# Patient Record
Sex: Male | Born: 1990 | Race: Black or African American | Hispanic: No | Marital: Single | Smoking: Never smoker
Health system: Southern US, Community
[De-identification: ages and names within clinical notes are randomized; demographics above are authoritative.]

## PROBLEM LIST (undated history)

## (undated) HISTORY — PX: HAND SURGERY: SHX662

---
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
## 2009-12-30 ENCOUNTER — Emergency Department (HOSPITAL_COMMUNITY): Admission: EM | Admit: 2009-12-30 | Discharge: 2009-12-30 | Payer: Self-pay | Admitting: Emergency Medicine

## 2010-08-12 LAB — DIFFERENTIAL
Basophils Absolute: 0 10*3/uL (ref 0.0–0.1)
Basophils Relative: 0 % (ref 0–1)
Eosinophils Absolute: 0 10*3/uL (ref 0.0–0.7)
Eosinophils Relative: 0 % (ref 0–5)
Lymphocytes Relative: 13 % (ref 12–46)
Lymphs Abs: 0.9 10*3/uL (ref 0.7–4.0)
Monocytes Absolute: 0.6 10*3/uL (ref 0.1–1.0)
Monocytes Relative: 9 % (ref 3–12)
Neutro Abs: 5.6 10*3/uL (ref 1.7–7.7)
Neutrophils Relative %: 78 % — ABNORMAL HIGH (ref 43–77)

## 2010-08-12 LAB — COMPREHENSIVE METABOLIC PANEL
ALT: 24 U/L (ref 0–53)
AST: 29 U/L (ref 0–37)
Albumin: 4.4 g/dL (ref 3.5–5.2)
Alkaline Phosphatase: 49 U/L (ref 39–117)
BUN: 5 mg/dL — ABNORMAL LOW (ref 6–23)
CO2: 25 mEq/L (ref 19–32)
Calcium: 9.6 mg/dL (ref 8.4–10.5)
Chloride: 99 mEq/L (ref 96–112)
Creatinine, Ser: 1.04 mg/dL (ref 0.4–1.5)
GFR calc Af Amer: >60 mL/min (ref >60)
GFR calc non Af Amer: >60 mL/min (ref >60)
Glucose, Bld: 115 mg/dL — ABNORMAL HIGH (ref 70–99)
Potassium: 3.3 mEq/L — ABNORMAL LOW (ref 3.5–5.1)
Sodium: 135 mEq/L (ref 135–145)
Total Bilirubin: 1.1 mg/dL (ref 0.3–1.2)
Total Protein: 8.4 g/dL — ABNORMAL HIGH (ref 6.0–8.3)

## 2010-08-12 LAB — CBC
HCT: 46.6 % (ref 39.0–52.0)
Hemoglobin: 16.8 g/dL (ref 13.0–17.0)
MCH: 31.1 pg (ref 26.0–34.0)
MCHC: 36.1 g/dL — ABNORMAL HIGH (ref 30.0–36.0)
MCV: 86.3 fL (ref 78.0–100.0)
Platelets: 199 10*3/uL (ref 150–400)
RBC: 5.4 MIL/uL (ref 4.22–5.81)
RDW: 11.2 % — ABNORMAL LOW (ref 11.5–15.5)
WBC: 7.1 10*3/uL (ref 4.0–10.5)

## 2010-08-12 LAB — URINALYSIS, ROUTINE W REFLEX MICROSCOPIC
Hgb urine dipstick: NEGATIVE
Specific Gravity, Urine: 1.027 (ref 1.005–1.030)
Urobilinogen, UA: 0.2 mg/dL (ref 0.0–1.0)

## 2010-08-12 LAB — URINE CULTURE: Culture  Setup Time: 201108041050

## 2010-08-12 LAB — GC/CHLAMYDIA PROBE AMP, URINE
Chlamydia, Swab/Urine, PCR: POSITIVE — AB
GC Probe Amp, Urine: NEGATIVE

## 2010-08-12 LAB — URINE MICROSCOPIC-ADD ON

## 2010-10-14 NOTE — Op Note (Signed)
   NAME:  Jeffrey Greene, Jeffrey Greene                         ACCOUNT NO.:  [id number]   MEDICAL RECORD NO.:  [id number]                   PATIENT TYPE:  AMB   LOCATION:  DSC                                  FACILITY:  [facility]   PHYSICIAN:  Matthew A. Mina Marble, M.D.           DATE OF BIRTH:  06/02/1990   DATE OF PROCEDURE:  02/26/2002  DATE OF DISCHARGE:  02/26/2002                                 OPERATIVE REPORT   PREOPERATIVE DIAGNOSIS:  Left thumb Bennett's type fracture.   POSTOPERATIVE DIAGNOSIS:  Left thumb Bennett's type fracture.   PROCEDURE:  Closed reduction and percutaneous pinning of above.   SURGEON:  Artist Pais. Mina Marble, M.D.   ASSISTANT:  ________.   ANESTHESIA:  General.   TOURNIQUET TIME:  20 minutes.   COMPLICATIONS:  None.   DESCRIPTION OF PROCEDURE:  The patient was taken to the operating room and  after the induction of general anesthesia, the left upper arm was prepped  and draped in the usual sterile fashion. The arm was elevated for five  minutes and the tourniquet was inflated to 100  mmHg.   At this point in time a closed reduction was performed of a fracture at the  base of the  thumb metacarpal, extra-articular and not involving the physis.  The reduction was performed and good reduction was achieved in both AP and  lateral and oblique views.   At this point in time an 0 4-5 K-wire was driven across the fracture site  from distal to proximal, thus maintaining the reduction. A second K-wire was  then paralleled more proximal. Intraoperative x-ray showed good reduction  and good placement of hardware in the AP and lateral and oblique views. The  wires were upon themselves outside the skin.  The patient was  then placed  in a sterile dressing with Xeroform, 4 x 4s  and a radial nerve splint.   The patient tolerated the procedure well.  He was  transferred to the  recovery room in stable fashion.                                               Artist Pais  Mina Marble, M.D.    MAW/MEDQ  D:  02/26/2002  T:  03/01/2002  Job:  [id number]

## 2011-06-23 ENCOUNTER — Emergency Department (HOSPITAL_COMMUNITY)
Admission: EM | Admit: 2011-06-23 | Discharge: 2011-06-24 | Disposition: A | Discharge status: Home / Self Care | Payer: Self-pay | Attending: Emergency Medicine | Admitting: Emergency Medicine

## 2011-06-23 ENCOUNTER — Encounter (HOSPITAL_COMMUNITY): Payer: Self-pay | Admitting: *Deleted

## 2011-06-23 DIAGNOSIS — S8990XA Unspecified injury of unspecified lower leg, initial encounter: Secondary | ICD-10-CM | POA: Insufficient documentation

## 2011-06-23 DIAGNOSIS — S93409A Sprain of unspecified ligament of unspecified ankle, initial encounter: Secondary | ICD-10-CM | POA: Insufficient documentation

## 2011-06-23 DIAGNOSIS — X500XXA Overexertion from strenuous movement or load, initial encounter: Secondary | ICD-10-CM | POA: Insufficient documentation

## 2011-06-23 MED ORDER — HYDROCODONE-ACETAMINOPHEN 5-325 MG PO TABS
1.0000 | ORAL_TABLET | Freq: Once | ORAL | Status: AC
  Administered 2011-06-23: 1 via ORAL
  Filled 2011-06-23: qty 1

## 2011-06-23 NOTE — ED Notes (Signed)
The pt is c/o some lt foot and ankle pain.  He stepped on a, limb earlier today twisting his foot and ankle.  C/o  pain

## 2011-06-23 NOTE — ED Provider Notes (Signed)
History     CSN: [id number]  Arrival date & time 06/23/11  2259   First MD Initiated Contact with Patient 06/23/11 2339      Chief Complaint  Patient presents with  . Foot Injury    (Consider location/radiation/quality/duration/timing/severity/associated sxs/prior treatment)  Patient is a 21 y.o. male presenting with foot injury. The history is provided by the patient.  Foot Injury    21 year old AA male presents after "rolling" his left ankle earlier today. Patient states he was walking and stepped on a branch and rolled his L ankle laterally. The incident occurred about noon (11 hrs earlier) today. Patient rates the pain as 10/10 and is located on the top and bottom of his L foot. Denies any pain in the ankle or leg.    History reviewed. No pertinent past medical history.  History reviewed. No pertinent past surgical history.  History reviewed. No pertinent family history.  History  Substance Use Topics  . Smoking status: Current Everyday Smoker  . Smokeless tobacco: Not on file  . Alcohol Use: No      Review of Systems All pertinent positives and negatives reviewed in the history of present illness  Allergies  Review of patient's allergies indicates no known allergies.  Home Medications  No current outpatient prescriptions on file.  BP 122/59  Pulse 92  Temp(Src) 97.4 F (36.3 C) (Oral)  Resp 16  SpO2 97%  Physical Exam  Constitutional: He appears well-developed and well-nourished. No distress.  HENT:  Head: Normocephalic and atraumatic.  Musculoskeletal:       Left ankle: He exhibits decreased range of motion. He exhibits no swelling, no deformity and normal pulse. no tenderness.       Left foot: He exhibits decreased range of motion. He exhibits no tenderness, no bony tenderness, no swelling, normal capillary refill and no deformity.       No edema present. Pulses present. Able to flex and extend L foot and ankle. Unable to evert and invert L foot.     Skin: He is not diaphoretic.    ED Course  Procedures (including critical care time)  Labs Reviewed - No data to display Dg Ankle Complete Left  06/24/2011  *RADIOLOGY REPORT*  Clinical Data: Pain after fall  LEFT ANKLE COMPLETE - 3+ VIEW  Comparison: None.  Findings: The left ankle appears intact.  No evidence of acute fracture or subluxation.  No focal bone lesion or bone destruction. Ankle mortis and talar dome appear intact.  Bone cortex and trabecular architecture appear intact.  Soft tissue calcifications consistent with phleboliths.  No radiopaque foreign bodies.  IMPRESSION: No acute bony abnormalities demonstrated.  Original Report Authenticated By: Marlon Pel, M.D.   Dg Foot Complete Left  06/24/2011  *RADIOLOGY REPORT*  Clinical Data: The dorsal and plantar surface pain after fall.  LEFT FOOT - COMPLETE 3+ VIEW  Comparison: None.  Findings: The left foot appears intact. No evidence of acute fracture or subluxation.  No focal bone lesions.  Bone matrix and cortex appear intact.  No abnormal radiopaque densities in the soft tissues.  IMPRESSION: No acute bony abnormalities identified.  Original Report Authenticated By: Marlon Pel, M.D.       Patient has no fractures noted on x-ray.  We will have him followup with orthopedics as needed.  ASO was applied.  Told to ice and elevate the ankle.  Pain control be given for home  MDM  MDM Reviewed: nursing note and vitals  Interpretation: x-ray            Carlyle Dolly, PA-C 06/24/11 [phone]

## 2011-06-23 NOTE — ED Notes (Signed)
Patient states that he twisted left ankle earlier today, complaining of pain on top of left foot and bottom of left foot.  No deformity or swelling noted at this time.

## 2011-06-24 ENCOUNTER — Emergency Department (HOSPITAL_COMMUNITY): Payer: Self-pay

## 2011-06-24 MED ORDER — HYDROCODONE-ACETAMINOPHEN 5-325 MG PO TABS: 1.0000 | ORAL_TABLET | Freq: Four times a day (QID) | ORAL | Status: AC | PRN

## 2011-06-24 NOTE — ED Provider Notes (Signed)
Medical screening examination/treatment/procedure(s) were performed by non-physician practitioner and as supervising physician I was immediately available for consultation/collaboration.   Hisayo Delossantos W Jodell Weitman, MD 06/24/11 0213 

## 2011-06-24 NOTE — ED Notes (Signed)
Patient returned from XRay

## 2011-06-24 NOTE — ED Notes (Signed)
Patient transported to X-ray 

## 2011-10-09 ENCOUNTER — Emergency Department (HOSPITAL_COMMUNITY): Payer: Self-pay

## 2011-10-09 ENCOUNTER — Emergency Department (HOSPITAL_COMMUNITY)
Admission: EM | Admit: 2011-10-09 | Discharge: 2011-10-09 | Disposition: A | Discharge status: Home / Self Care | Payer: Self-pay | Attending: Emergency Medicine | Admitting: Emergency Medicine

## 2011-10-09 ENCOUNTER — Encounter (HOSPITAL_COMMUNITY): Payer: Self-pay | Admitting: Emergency Medicine

## 2011-10-09 DIAGNOSIS — R609 Edema, unspecified: Secondary | ICD-10-CM | POA: Insufficient documentation

## 2011-10-09 DIAGNOSIS — R42 Dizziness and giddiness: Secondary | ICD-10-CM | POA: Insufficient documentation

## 2011-10-09 DIAGNOSIS — S6990XA Unspecified injury of unspecified wrist, hand and finger(s), initial encounter: Secondary | ICD-10-CM | POA: Insufficient documentation

## 2011-10-09 DIAGNOSIS — R0602 Shortness of breath: Secondary | ICD-10-CM | POA: Insufficient documentation

## 2011-10-09 DIAGNOSIS — J45909 Unspecified asthma, uncomplicated: Secondary | ICD-10-CM | POA: Insufficient documentation

## 2011-10-09 DIAGNOSIS — M79609 Pain in unspecified limb: Secondary | ICD-10-CM | POA: Insufficient documentation

## 2011-10-09 DIAGNOSIS — S60229A Contusion of unspecified hand, initial encounter: Secondary | ICD-10-CM | POA: Insufficient documentation

## 2011-10-09 DIAGNOSIS — M7989 Other specified soft tissue disorders: Secondary | ICD-10-CM | POA: Insufficient documentation

## 2011-10-09 MED ORDER — IBUPROFEN 200 MG PO TABS
400.0000 mg | ORAL_TABLET | Freq: Once | ORAL | Status: AC
  Administered 2011-10-09: 400 mg via ORAL
  Filled 2011-10-09: qty 2

## 2011-10-09 MED ORDER — OXYCODONE-ACETAMINOPHEN 5-325 MG PO TABS
2.0000 | ORAL_TABLET | Freq: Once | ORAL | Status: AC
  Administered 2011-10-09: 2 via ORAL
  Filled 2011-10-09: qty 2

## 2011-10-09 MED ORDER — OXYCODONE-ACETAMINOPHEN 5-325 MG PO TABS: 1.0000 | ORAL_TABLET | ORAL | Status: AC | PRN

## 2011-10-09 MED ORDER — NAPROXEN 500 MG PO TABS: 500.0000 mg | ORAL_TABLET | Freq: Two times a day (BID) | ORAL | Status: DC | PRN
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
## 2012-03-06 ENCOUNTER — Emergency Department (HOSPITAL_COMMUNITY): Payer: No Typology Code available for payment source

## 2012-03-06 ENCOUNTER — Emergency Department (HOSPITAL_COMMUNITY)
Admission: EM | Admit: 2012-03-06 | Discharge: 2012-03-06 | Disposition: A | Discharge status: Home / Self Care | Payer: No Typology Code available for payment source | Attending: Emergency Medicine | Admitting: Emergency Medicine

## 2012-03-06 ENCOUNTER — Encounter (HOSPITAL_COMMUNITY): Payer: Self-pay | Admitting: Emergency Medicine

## 2012-03-06 DIAGNOSIS — J45909 Unspecified asthma, uncomplicated: Secondary | ICD-10-CM | POA: Insufficient documentation

## 2012-03-06 DIAGNOSIS — S161XXA Strain of muscle, fascia and tendon at neck level, initial encounter: Secondary | ICD-10-CM

## 2012-03-06 DIAGNOSIS — M542 Cervicalgia: Secondary | ICD-10-CM | POA: Insufficient documentation

## 2012-03-06 MED ORDER — CYCLOBENZAPRINE HCL 10 MG PO TABS: 10.0000 mg | ORAL_TABLET | Freq: Three times a day (TID) | ORAL | Status: DC | PRN

## 2012-03-06 MED ORDER — IBUPROFEN 800 MG PO TABS: 800.0000 mg | ORAL_TABLET | Freq: Three times a day (TID) | ORAL | Status: DC

## 2012-03-06 MED ORDER — IBUPROFEN 800 MG PO TABS
800.0000 mg | ORAL_TABLET | Freq: Once | ORAL | Status: AC
  Administered 2012-03-06: 800 mg via ORAL
  Filled 2012-03-06: qty 1

## 2012-03-06 MED ORDER — CYCLOBENZAPRINE HCL 10 MG PO TABS
5.0000 mg | ORAL_TABLET | Freq: Once | ORAL | Status: AC
  Administered 2012-03-06: 5 mg via ORAL
  Filled 2012-03-06: qty 1

## 2012-03-06 NOTE — ED Notes (Signed)
Returned from radiology. 
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
## 2014-03-17 ENCOUNTER — Emergency Department (HOSPITAL_COMMUNITY): Payer: Self-pay

## 2014-03-17 ENCOUNTER — Emergency Department (HOSPITAL_COMMUNITY)
Admission: EM | Admit: 2014-03-17 | Discharge: 2014-03-18 | Disposition: A | Discharge status: Home / Self Care | Payer: Self-pay | Attending: Emergency Medicine | Admitting: Emergency Medicine

## 2014-03-17 ENCOUNTER — Encounter (HOSPITAL_COMMUNITY): Payer: Self-pay | Admitting: Emergency Medicine

## 2014-03-17 DIAGNOSIS — J45909 Unspecified asthma, uncomplicated: Secondary | ICD-10-CM | POA: Insufficient documentation

## 2014-03-17 DIAGNOSIS — Z791 Long term (current) use of non-steroidal anti-inflammatories (NSAID): Secondary | ICD-10-CM | POA: Insufficient documentation

## 2014-03-17 DIAGNOSIS — Y990 Civilian activity done for income or pay: Secondary | ICD-10-CM | POA: Insufficient documentation

## 2014-03-17 DIAGNOSIS — Y9289 Other specified places as the place of occurrence of the external cause: Secondary | ICD-10-CM | POA: Insufficient documentation

## 2014-03-17 DIAGNOSIS — W260XXA Contact with knife, initial encounter: Secondary | ICD-10-CM | POA: Insufficient documentation

## 2014-03-17 DIAGNOSIS — S61210A Laceration without foreign body of right index finger without damage to nail, initial encounter: Secondary | ICD-10-CM | POA: Insufficient documentation

## 2014-03-17 DIAGNOSIS — S61219A Laceration without foreign body of unspecified finger without damage to nail, initial encounter: Secondary | ICD-10-CM

## 2014-03-17 DIAGNOSIS — Y9389 Activity, other specified: Secondary | ICD-10-CM | POA: Insufficient documentation

## 2014-03-17 DIAGNOSIS — Z23 Encounter for immunization: Secondary | ICD-10-CM | POA: Insufficient documentation

## 2014-03-17 NOTE — ED Provider Notes (Signed)
CSN: [id number]     Arrival date & time 03/17/14  2317 History  This chart was scribed for Felicie Mornavid Ryelee Albee, NP working with Tomasita CrumbleAdeleke Oni, MD by Evon Slackerrance Branch, ED Scribe. This patient was seen in room TR06C/TR06C and the patient's care was started at 11:35 PM.     Chief Complaint  Patient presents with  . Laceration   Patient is a 23 y.o. male presenting with skin laceration. The history is provided by the patient. No language interpreter was used.  Laceration Location:  Hand Hand laceration location:  R hand Quality: avulsion   Bleeding: controlled   Laceration mechanism:  Knife Relieved by:  Nothing Worsened by:  Nothing tried Ineffective treatments:  None tried Tetanus status:  Out of date  HPI Comments: Jeffrey Greene is a 23 y.o. male who presents to the Emergency Department complaining of right hand laceration onset tonight PTA. He states that he the cut is to the palm of his hand. He states that he can move his hand but it is very painful. He states he accidentally cut his hand when drying the knife tonight at work. He states that his tetanus is not up to date. He denies any other symptoms.   Past Medical History  Diagnosis Date  . Asthma    Past Surgical History  Procedure Laterality Date  . Hand surgery     No family history on file. History  Substance Use Topics  . Smoking status: Never Smoker   . Smokeless tobacco: Not on file  . Alcohol Use: Yes     Comment: occasion    Review of Systems  Skin: Positive for wound.  All other systems reviewed and are negative.     Allergies  Review of patient's allergies indicates no known allergies.  Home Medications   Prior to Admission medications   Medication Sig Start Date End Date Taking? Authorizing Provider  cyclobenzaprine (FLEXERIL) 10 MG tablet Take 1 tablet (10 mg total) by mouth 3 (three) times daily as needed for muscle spasms. 03/06/12   Sunnie NielsenBrian Opitz, MD  ibuprofen (ADVIL,MOTRIN) 800 MG tablet Take 1 tablet  (800 mg total) by mouth 3 (three) times daily. 03/06/12   Sunnie NielsenBrian Opitz, MD   Triage Vitals: BP 132/83  Pulse 96  Temp(Src) 99.1 F (37.3 C) (Oral)  Resp 14  Ht 5\' 9"  (1.753 m)  Wt 180 lb (81.647 kg)  BMI 26.57 kg/m2  SpO2 100%  Physical Exam  Nursing note and vitals reviewed. Constitutional: He is oriented to person, place, and time. He appears well-developed and well-nourished. No distress.  HENT:  Head: Normocephalic and atraumatic.  Eyes: Conjunctivae and EOM are normal.  Neck: Neck supple. No tracheal deviation present.  Cardiovascular: Normal rate.   Pulmonary/Chest: Effort normal. No respiratory distress.  Musculoskeletal: Normal range of motion.  Good flexion and extension at MP, PIP, and DIP joints without weakness.  Neurological: He is alert and oriented to person, place, and time.  Skin: Skin is warm and dry. Laceration noted.  2x2.5cm flab avulsion over the MP joint of index finger   Psychiatric: He has a normal mood and affect. His behavior is normal.    ED Course  Procedures (including critical care time)  LACERATION REPAIR Performed by: Jimmye NormanSMITH,Jayleen Scaglione JOHN Authorized by: Jimmye NormanSMITH,Johnanthony Wilden JOHN Consent: Verbal consent obtained. Risks and benefits: risks, benefits and alternatives were discussed Consent given by: patient Patient identity confirmed: provided demographic data Prepped and Draped in normal sterile fashion Wound explored  Laceration Location: MP  right index finger  Laceration Length: 2 x 2.5 cm  No Foreign Bodies seen or palpated  Anesthesia: local infiltration  Local anesthetic: lidocaine 1%  Anesthetic total: 4 ml  Irrigation method: syringe Amount of cleaning: standard  Skin closure: 4.0 prolene  Number of sutures: 9  Technique: simple interrupted  Patient tolerance: Patient tolerated the procedure well with no immediate complications. DIAGNOSTIC STUDIES: Oxygen Saturation is 100% on RA, normal by my interpretation.    COORDINATION OF  CARE: 11:47 PM-Discussed treatment plan which includes laceration repair with pt at bedside and pt agreed to plan.     Labs Review Labs Reviewed - No data to display  Imaging Review No results found.   EKG Interpretation None     Tetanus updated.  Sutured wound closure. Return precautions discussed. Suture removal 7-10 days. MDM   Final diagnoses:  None    Right index finger laceration.   I personally performed the services described in this documentation, which was scribed in my presence. The recorded information has been reviewed and is accurate.      Jimmye Normanavid John Caedence Snowden, NP 03/18/14 [phone]

## 2014-03-17 NOTE — ED Notes (Signed)
Pt. accidentally sliced his right posterior hand with a knife while at work ( [employer]) this evening , dressing applied prior to arrival with no bleeding through dressing .

## 2014-03-18 MED ORDER — LIDOCAINE HCL (PF) 1 % IJ SOLN
10.0000 mL | Freq: Once | INTRAMUSCULAR | Status: AC
  Administered 2014-03-18: 10 mL
  Filled 2014-03-18: qty 10

## 2014-03-18 MED ORDER — TETANUS-DIPHTH-ACELL PERTUSSIS 5-2.5-18.5 LF-MCG/0.5 IM SUSP
0.5000 mL | Freq: Once | INTRAMUSCULAR | Status: AC
  Administered 2014-03-18: 0.5 mL via INTRAMUSCULAR
  Filled 2014-03-18: qty 0.5

## 2014-03-18 MED ORDER — HYDROCODONE-ACETAMINOPHEN 5-325 MG PO TABS: 1.0000 | ORAL_TABLET | Freq: Four times a day (QID) | ORAL | Status: DC | PRN

## 2014-03-18 NOTE — ED Provider Notes (Signed)
Medical screening examination/treatment/procedure(s) were performed by non-physician practitioner and as supervising physician I was immediately available for consultation/collaboration.   EKG Interpretation None        Tomasita CrumbleAdeleke Aishani Kalis, MD 03/18/14 [phone]

## 2014-03-18 NOTE — Progress Notes (Signed)
Orthopedic Tech Progress Note Patient Details:  Jeffrey ApleyGerrell H Greene 01/12/1991 [id number]  Ortho Devices Type of Ortho Device: Finger splint Ortho Device/Splint Interventions: Application   [name], [name] 03/18/2014, 1:36 AM

## 2014-03-18 NOTE — Discharge Instructions (Signed)

## 2014-03-24 ENCOUNTER — Emergency Department (HOSPITAL_COMMUNITY)
Admission: EM | Admit: 2014-03-24 | Discharge: 2014-03-24 | Disposition: A | Discharge status: Home / Self Care | Payer: No Typology Code available for payment source | Attending: Emergency Medicine | Admitting: Emergency Medicine

## 2014-03-24 ENCOUNTER — Encounter (HOSPITAL_COMMUNITY): Payer: Self-pay | Admitting: Emergency Medicine

## 2014-03-24 DIAGNOSIS — J45909 Unspecified asthma, uncomplicated: Secondary | ICD-10-CM | POA: Insufficient documentation

## 2014-03-24 DIAGNOSIS — Z791 Long term (current) use of non-steroidal anti-inflammatories (NSAID): Secondary | ICD-10-CM | POA: Insufficient documentation

## 2014-03-24 DIAGNOSIS — Z4802 Encounter for removal of sutures: Secondary | ICD-10-CM | POA: Insufficient documentation

## 2014-03-24 DIAGNOSIS — IMO0002 Reserved for concepts with insufficient information to code with codable children: Secondary | ICD-10-CM

## 2014-03-24 NOTE — ED Notes (Signed)
Pt here for suture removal from right index finger, they were placed one week ago.

## 2014-03-24 NOTE — ED Provider Notes (Signed)
CSN: [id number]     Arrival date & time 03/24/14  [id number] History  This chart was scribed for non-physician practitioner, Trixie DredgeEmily Trea Carnegie, PA-C working with Flint MelterElliott L Wentz, MD by Greggory StallionKayla Andersen, ED scribe. This patient was seen in room TR05C/TR05C and the patient's care was started at 9:47 AM.   Chief Complaint  Patient presents with  . Suture / Staple Removal   The history is provided by the patient. No language interpreter was used.   HPI Comments: Jeffrey Greene is a 23 y.o. male who presents to the Emergency Department requesting suture removal. Pt had 9 sutures placed in his right index finger 7 days ago after accidentally cutting it with a knife. Denies other injuries. States there is still mild pain and swelling to the finger but it has started decreasing. Denies any pus or blood drainage from the area. He has used antibacterial ointment over the wound.   Past Medical History  Diagnosis Date  . Asthma    Past Surgical History  Procedure Laterality Date  . Hand surgery     History reviewed. No pertinent family history. History  Substance Use Topics  . Smoking status: Never Smoker   . Smokeless tobacco: Not on file  . Alcohol Use: Yes     Comment: occasion    Review of Systems  Skin: Positive for wound.  All other systems reviewed and are negative.  Allergies  Review of patient's allergies indicates no known allergies.  Home Medications   Prior to Admission medications   Medication Sig Start Date End Date Taking? Authorizing Provider  cyclobenzaprine (FLEXERIL) 10 MG tablet Take 1 tablet (10 mg total) by mouth 3 (three) times daily as needed for muscle spasms. 03/06/12   Sunnie NielsenBrian Opitz, MD  HYDROcodone-acetaminophen (NORCO/VICODIN) 5-325 MG per tablet Take 1 tablet by mouth every 6 (six) hours as needed for severe pain. 03/18/14   [name], NP  ibuprofen (ADVIL,MOTRIN) 800 MG tablet Take 1 tablet (800 mg total) by mouth 3 (three) times daily. 03/06/12   Sunnie NielsenBrian Opitz, MD    BP 116/56  Pulse 67  Temp(Src) 97.9 F (36.6 C) (Oral)  Resp 18  Ht 5\' 9"  (1.753 m)  Wt 180 lb (81.647 kg)  BMI 26.57 kg/m2  SpO2 100%  Physical Exam  Nursing note and vitals reviewed. Constitutional: He appears well-developed and well-nourished. No distress.  HENT:  Head: Normocephalic and atraumatic.  Neck: Neck supple.  Pulmonary/Chest: Effort normal.  Musculoskeletal: He exhibits edema and tenderness.  Right index finger over the radial aspect of the MCP with triangular laceration with sutures intact. Some scabbing noted. No erythema, warmth or discharge. Mildly tender. Mild edema. Full active ROM of finger. Sensation intact.  Capillary refill < 2 seconds.   Neurological: He is alert.  Skin: He is not diaphoretic.    ED Course  Procedures (including critical care time)  DIAGNOSTIC STUDIES: Oxygen Saturation is 100% on RA, normal by my interpretation.    COORDINATION OF CARE: 9:49 AM-Advised pt that sutures need to stay in place for 5-7 more days then he can return for removal. Pt is agreeable to plan.    Labs Review Labs Reviewed - No data to display  Imaging Review No results found.   EKG Interpretation None      MDM   Final diagnoses:  Laceration re-check    Afebrile, nontoxic patient with laceration repair 7 days ago.  It is healing appropriately.  No e/o infection.  Laceration is not healed  enough at this point to take the sutures out.  Discussed wound care and return precautions.  Advised to return or go to PCP, urgent care in 5-7 days for recheck and suture removal.   D/C home.  Discussed result, findings, treatment, and follow up  with patient.  Pt given return precautions.  Pt verbalizes understanding and agrees with plan.       I personally performed the services described in this documentation, which was scribed in my presence. The recorded information has been reviewed and is accurate.  DickeyvilleEmily Jolonda Gomm, PA-C 03/24/14 [phone]

## 2014-03-24 NOTE — Discharge Instructions (Signed)
Read the information below.  You may return to the Emergency Department at any time for worsening condition or any new symptoms that concern you.  If you develop redness, swelling, pus draining from the wound, or fevers greater than 100.4, return to the ER immediately for a recheck.     Wound Check Your wound appears healthy today. Your wound will heal gradually over time. Eventually a scar will form that will fade with time. FACTORS THAT AFFECT SCAR FORMATION:  People differ in the severity in which they scar.  Scar severity varies according to location, size, and the traits you inherited from your parents (genetic predisposition).  Irritation to the wound from infection, rubbing, or chemical exposure will increase the amount of scar formation. HOME CARE INSTRUCTIONS   If you were given a dressing, you should change it at least once a day or as instructed by your caregiver. If the bandage sticks, soak it off with a solution of hydrogen peroxide.  If the bandage becomes wet, dirty, or develops a bad smell, change it as soon as possible.  Look for signs of infection.  Only take over-the-counter or prescription medicines for pain, discomfort, or fever as directed by your caregiver. SEEK IMMEDIATE MEDICAL CARE IF:   You have redness, swelling, or increasing pain in the wound.  You notice pus coming from the wound.  You have a fever.  You notice a bad smell coming from the wound or dressing. Document Released: 02/19/2004 Document Revised: 08/07/2011 Document Reviewed: 05/15/2005 [facility] Patient Information 2015 [employer] [place] [place]. This information is not intended to replace advice given to you by your health care provider. Make sure you discuss any questions you have with your health care provider.

## 2014-03-24 NOTE — ED Notes (Signed)
Bacitracin and gauze to wound area.

## 2014-03-25 NOTE — ED Provider Notes (Signed)
Medical screening examination/treatment/procedure(s) were performed by non-physician practitioner and as supervising physician I was immediately available for consultation/collaboration.   EKG Interpretation None       Flint MelterElliott L Orey Moure, MD 03/25/14 1028

## 2014-04-02 ENCOUNTER — Emergency Department (HOSPITAL_COMMUNITY)
Admission: EM | Admit: 2014-04-02 | Discharge: 2014-04-02 | Disposition: A | Discharge status: Home / Self Care | Payer: No Typology Code available for payment source | Attending: Emergency Medicine | Admitting: Emergency Medicine

## 2014-04-02 ENCOUNTER — Encounter (HOSPITAL_COMMUNITY): Payer: Self-pay | Admitting: Emergency Medicine

## 2014-04-02 DIAGNOSIS — Z791 Long term (current) use of non-steroidal anti-inflammatories (NSAID): Secondary | ICD-10-CM | POA: Insufficient documentation

## 2014-04-02 DIAGNOSIS — J45909 Unspecified asthma, uncomplicated: Secondary | ICD-10-CM | POA: Insufficient documentation

## 2014-04-02 DIAGNOSIS — Z4802 Encounter for removal of sutures: Secondary | ICD-10-CM | POA: Insufficient documentation

## 2014-04-02 NOTE — ED Notes (Signed)
Pt presents for suture removal to right hand that were place 16 days ago, no redness or drainage noted to site.

## 2014-04-02 NOTE — ED Provider Notes (Signed)
CSN: [id number]     Arrival date & time 04/02/14  1743 History  This chart was scribed for non-physician practitioner working with Flint MelterElliott L Wentz, MD by Elveria Risingimelie Horne, ED Scribe. This patient was seen in room TR09C/TR09C and the patient's care was started at 7:40 PM.   Chief Complaint  Patient presents with  . Suture / Staple Removal   The history is provided by the patient. No language interpreter was used.   HPI Comments: Jeffrey Greene is a 23 y.o. male with past medical history of asthma who presents to the Emergency Department to have sutures removed from right index finger laceration; laceration repaired 03/18/14 with nine sutures. This is patient's second attempt at removal. Patient was here 03/24/14 but his laceration had not healed and removal was inappropriate at time. Patient instructed to allow 5-7 days for additional healing and to return to have sutures removed. Patient denies complications, additional injury, swelling, or red streaking. Patient states that he has kept the laceration covered and has been cleaning his wound with warm soap and water twice a day.   Past Medical History  Diagnosis Date  . Asthma    Past Surgical History  Procedure Laterality Date  . Hand surgery     No family history on file. History  Substance Use Topics  . Smoking status: Never Smoker   . Smokeless tobacco: Not on file  . Alcohol Use: Yes     Comment: occasion    Review of Systems  Constitutional: Negative for fever and chills.  Skin: Positive for wound. Negative for color change.  Neurological: Negative for weakness and numbness.    Allergies  Review of patient's allergies indicates no known allergies.  Home Medications   Prior to Admission medications   Medication Sig Start Date End Date Taking? Authorizing Provider  HYDROcodone-acetaminophen (NORCO/VICODIN) 5-325 MG per tablet Take 1 tablet by mouth every 6 (six) hours as needed for severe pain. 03/18/14  Yes Jimmye Normanavid John  Smith, NP  cyclobenzaprine (FLEXERIL) 10 MG tablet Take 1 tablet (10 mg total) by mouth 3 (three) times daily as needed for muscle spasms. Patient not taking: Reported on 04/02/2014 03/06/12   Sunnie NielsenBrian Opitz, MD  ibuprofen (ADVIL,MOTRIN) 800 MG tablet Take 1 tablet (800 mg total) by mouth 3 (three) times daily. Patient not taking: Reported on 04/02/2014 03/06/12   Sunnie NielsenBrian Opitz, MD   Triage Vitals: BP 115/58 mmHg  Pulse 72  Temp(Src) 97.7 F (36.5 C) (Oral)  Resp 16  Ht 5\' 9"  (1.753 m)  Wt 180 lb (81.647 kg)  BMI 26.57 kg/m2   Physical Exam  Constitutional: He is oriented to person, place, and time. He appears well-developed and well-nourished. No distress.  HENT:  Head: Normocephalic and atraumatic.  Eyes: Conjunctivae and EOM are normal. Right eye exhibits no discharge. Left eye exhibits no discharge.  Neck: Normal range of motion. Neck supple. No tracheal deviation present.  Cardiovascular: Normal rate, regular rhythm and normal heart sounds.  Exam reveals no friction rub.   No murmur heard. Pulses:      Radial pulses are 2+ on the right side, and 2+ on the left side.  Cap refill < 3 seconds  Pulmonary/Chest: Effort normal and breath sounds normal. No respiratory distress. He has no wheezes. He has no rales.  Musculoskeletal: Normal range of motion.  Full ROM to upper and lower extremities without difficulty noted, negative ataxia noted.  Neurological: He is alert and oriented to person, place, and time. No cranial  nerve deficit. He exhibits normal muscle tone. Coordination normal.  Skin: Skin is warm and dry.  Laceration identified to the base of the right index finger that is healing well. 9 sutures in place, one suture became undone. Negative dehiscence, swelling, redness, erythema, bleeding, drainage. Negative findings a cellulitic infection.  Psychiatric: He has a normal mood and affect. His behavior is normal.  Nursing note and vitals reviewed.   ED Course  Procedures (including  critical care time)  COORDINATION OF CARE: 7:44 PM- Discussed treatment plan with patient at bedside and patient agreed to plan.   7:48 PM- Dr. Effie ShyWentz evaluated laceration and decided that his sutures are appropriate for removal.  Labs Review Labs Reviewed - No data to display  Imaging Review No results found.   EKG Interpretation None       SUTURE REMOVAL Performed by: Raymon MuttonSciacca, Jatorian Renault  Consent: Verbal consent obtained. Patient identity confirmed: provided demographic data Time out: Immediately prior to procedure a "time out" was called to verify the correct patient, procedure, equipment, support staff and site/side marked as required. Location details: base of right index finger, dorsal aspect Wound Appearance: clean Sutures/Staples Removed: 9 Facility: sutures placed in this facility Patient tolerance: Patient tolerated the procedure well with no immediate complications.     MDM   Final diagnoses:  Visit for suture removal    Medications - No data to display  Filed Vitals:   04/02/14 1810 04/02/14 1956  BP: 115/58 117/63  Pulse: 72 70  Temp: 97.7 F (36.5 C) 97.9 F (36.6 C)  TempSrc: Oral Oral  Resp: 16 18  Height: 5\' 9"  (1.753 m)   Weight: 180 lb (81.647 kg)   SpO2:  98%   I personally performed the services described in this documentation, which was scribed in my presence. The recorded information has been reviewed and is accurate.  Patient presenting to the emergency department with suture removal. Patient had history of laceration repair performed on 03/18/2014. 9 sutures placed. Patient was seen again in the ED setting on 03/24/2014-sutures are not ready to be removed. Patient seen and assessed by attending physician, Dr. Georgetta HaberWentz-who recommended patients to have sutures removed. Wound healing well and negative dehiscence. Negative findings of cellulitic infection. Sutures properly removed-patient tolerated procedure well. Patient stable, afebrile.  Nonseptic appearing. Negative signs of infection. Negative focal neurological deficits noted. Full range of motion identified. Sensation intact. Pulses palpable and strong. Discharged patient. Discussed with patient proper wound care. Discussed with patient to closely monitor symptoms and if symptoms are to worsen or change to report back to the ED - strict return instructions given.  Patient agreed to plan of care, understood, all questions answered.   Raymon MuttonMarissa Sanna Porcaro, PA-C 04/02/14 2039  Flint MelterElliott L Wentz, MD 04/03/14 [phone]

## 2014-04-02 NOTE — ED Provider Notes (Signed)
  Face-to-face evaluation   History: Scheduled recheck for suture removal. Prolonged suture closure.  Physical exam:  Right hand- sutured laceration over proximal radial aspect second MC.  Wound edges are overlapping.  The wound is approximated.  There is no associated erythema, fluctuance or drainage.  He has somewhat limited second MCP motion secondary to pain.  Medical screening examination/treatment/procedure(s) were conducted as a shared visit with non-physician practitioner(s) and myself.  I personally evaluated the patient during the encounter  Flint MelterElliott L Mckell Riecke, MD 04/03/14 2348

## 2014-04-02 NOTE — Discharge Instructions (Signed)
Please call your doctor for a followup appointment within 24-48 hours. When you talk to your doctor please let them know that you were seen in the emergency department and have them acquire all of your records so that they can discuss the findings with you and formulate a treatment plan to fully care for your new and ongoing problems. Please wash wound with warm order and so police are to 4 times per day Please keep covered Please apply antibiotic ointment If dressing becomes wet please change Please continue to monitor symptoms closely and if symptoms are to worsen or change (fever greater than 101, chills, sweating, nausea, vomiting, chest pain, shortness of breathe, difficulty breathing, weakness, numbness, tingling, worsening or changes to pain pattern, Swelling, drainage, opening of wound, fall, injury, red streaks, decreased range of motion to the finger) please report back to the Emergency Department immediately.    Suture Removal, Care After Refer to this sheet in the next few weeks. These instructions provide you with information on caring for yourself after your procedure. Your health care provider may also give you more specific instructions. Your treatment has been planned according to current medical practices, but problems sometimes occur. Call your health care provider if you have any problems or questions after your procedure. WHAT TO EXPECT AFTER THE PROCEDURE After your stitches (sutures) are removed, it is typical to have the following:  Some discomfort and swelling in the wound area.  Slight redness in the area. HOME CARE INSTRUCTIONS   If you have skin adhesive strips over the wound area, do not take the strips off. They will fall off on their own in a few days. If the strips remain in place after 14 days, you may remove them.  Change any bandages (dressings) at least once a day or as directed by your health care provider. If the bandage sticks, soak it off with warm, soapy  water.  Apply cream or ointment only as directed by your health care provider. If using cream or ointment, wash the area with soap and water 2 times a day to remove all the cream or ointment. Rinse off the soap and pat the area dry with a clean towel.  Keep the wound area dry and clean. If the bandage becomes wet or dirty, or if it develops a bad smell, change it as soon as possible.  Continue to protect the wound from injury.  Use sunscreen when out in the sun. New scars become sunburned easily. SEEK MEDICAL CARE IF:  You have increasing redness, swelling, or pain in the wound.  You see pus coming from the wound.  You have a fever.  You notice a bad smell coming from the wound or dressing.  Your wound breaks open (edges not staying together). Document Released: 02/07/2001 Document Revised: 03/05/2013 Document Reviewed: 12/25/2012 [facility] Patient Information 2015 [employer], [place]. This information is not intended to replace advice given to you by your health care provider. Make sure you discuss any questions you have with your health care provider.   Emergency Department Resource Guide 1) Find a Doctor and Pay Out of Pocket Although you won't have to find out who is covered by your insurance plan, it is a good idea to ask around and get recommendations. You will then need to call the office and see if the doctor you have chosen will accept you as a new patient and what types of options they offer for patients who are self-pay. Some doctors offer discounts or will  set up payment plans for their patients who do not have insurance, but you will need to ask so you aren't surprised when you get to your appointment.  2) Contact Your Local Health Department Not all health departments have doctors that can see patients for sick visits, but many do, so it is worth a call to see if yours does. If you don't know where your local health department is, you can check in your phone book. The CDC also  has a tool to help you locate your state's health department, and many state websites also have listings of all of their local health departments.  3) Find a Walk-in Clinic If your illness is not likely to be very severe or complicated, you may want to try a walk in clinic. These are popping up all over the country in pharmacies, drugstores, and shopping centers. They're usually staffed by nurse practitioners or physician assistants that have been trained to treat common illnesses and complaints. They're usually fairly quick and inexpensive. However, if you have serious medical issues or chronic medical problems, these are probably not your best option.  No Primary Care Doctor: - Call Health Connect at  [phone] - they can help you locate a primary care doctor that  accepts your insurance, provides certain services, etc. - Physician Referral Service- [phone]  Chronic Pain Problems: Organization         Address  Phone   Notes  [facility]  [phone] Patients need to be referred by their primary care doctor.   Medication Assistance: Organization         Address  Phone   Notes  [facility] Medication [facility] [address] [place], [place] [address] [phone] --Must be a resident of [facility] -- Must have NO insurance coverage whatsoever (no Medicaid/ Medicare, etc.) -- The pt. MUST have a primary care doctor that directs their care regularly and follows them in the community   [facility]  [phone]   [employer]  [phone]    Agencies that provide inexpensive medical care: Organization         Address  Phone   Notes  [facility]  [phone]   [facility]    [phone]   [facility] [address] [place] [address] [place], [place] [address] [phone]   [facility] [address], [place] [phone]   Planned Parenthood    [phone]    [facility]    [phone]   Community Health and [facility]  [address], [place] Phone:  [phone], Fax:  [phone] Hours of Operation:  9 am - 6 pm, M-F.  Also accepts Medicaid/Medicare and self-pay.  [facility]  [address], [place] Phone: [phone], Fax: [phone]. Hours of Operation:  8:30 am - 5:30 pm, M-F.  Also accepts Medicaid and self-pay.  [facility] [place] [address] [place] Phone: [phone]   [facility] [address] Natasha Bence [place], [place] [phone], Ext. [phone] Mondays & Thursdays: 7-9 AM.  First 15 patients are seen on a first come, first serve basis.    Medicaid-accepting [facility] Providers:  Organization         Address  Phone   Notes  [facility] [address],  [phone]  Also accepts self-pay patients.  [facility] [address] Laurell Josephs [place], [place]  [phone]   [facility] [address], [place] [phone]   [facility] [address] [place] [phone]   Renaye Rakers [address], [place]   [phone] Only accepts [place] patients after they have their name applied to their card.   Self-Pay (no insurance) in [facility]:  Organization         Address  Phone   Notes  Sickle Cell Patients, [facility] [address] [place], [place] [phone]   [facility] [address] [place], [place] [phone]   [facility] [place]  [address], [place] [phone]   [facility]/Dr. Osei-Bonsu  [address] [place] or [address], [place] [phone] Phone number for both [place] and [place] locations is the same.  [facility] [address], [place] [phone]   [facility] [address] [place] or [address] [phone]   [facility] [address] [place] [phone], phone; [phone], fax Sees patients 1st and 3rd Saturday of every month.  Must not qualify for public or private insurance (i.e. Medicaid, Medicare, [place] Health Choice, Veterans' Benefits)  Household income should be no more than 200% of the poverty level The clinic cannot treat you if you are pregnant or think you are pregnant  Sexually transmitted diseases are not treated at the clinic.    Dental Care: Organization         Address  Phone  Notes  [facility] Department of [facility] [address] [place], [place] [phone] Accepts children up to age 3 who are enrolled in [place] or [place] Health Choice; pregnant women with a Medicaid card; and children who have applied for Medicaid or [place] Health Choice, but were declined, whose parents can pay a reduced fee at time of service.  [facility] Department of [facility]  [address], [place] [phone] Accepts children up to age 89 who are enrolled in [place] or [place] Health Choice; pregnant women with a Medicaid card; and children who have applied for Medicaid or [place] Health Choice, but were declined, whose parents can pay a reduced fee at time of service.  [facility]  [address] [place], [place] [phone] Patients are seen by appointment only. Walk-ins are not accepted. [facility] will see patients 1 years of age and older. Monday - Tuesday (8am-5pm) Most Wednesdays (8:30-5pm) $30 per visit, cash only  [facility]  [address], [facility] [phone] Patients are seen by appointment only. Walk-ins are not accepted. [facility] will see patients 37 years of age and older. One Wednesday  Evening (Monthly: Volunteer Based).  $30 per visit, cash only  [employer]  [phone] for adults; Children under age 99, call Graduate Pediatric Dentistry at [phone]. Children aged 2-14, please call [phone] to request a pediatric application.  Dental services are provided in all areas of dental care including fillings, crowns and bridges, complete and partial dentures, implants, gum treatment, root canals, and extractions. Preventive care is also provided. Treatment  is provided to both adults and children. Patients are selected via a lottery and there is often a waiting list.   [facility] [address], [place]  [phone]) [phone] [email or web address]   [facility] [address], [place], [place] [phone], Ext. [phone] Second and Fourth Thursday of each month, opens at 6:30 AM; Clinic ends at 9 AM.  Patients are seen on a first-come first-served basis, and a limited number are seen during each clinic.   [facility]  [address], [place], [place] [phone]   Eligibility Requirements You must have lived in [place], [place], or [place] counties for at least the last three months.   You cannot be eligible for state or federal sponsored [place] Cityhealthcare insurance, including CIG[employer]Veterans Administration, [place]icaid, or [employer]Medicare.   You generally cannot be eligible for healthcare insurance through your employer.    How to apply: Eligibility screenings are held every Tuesday and Wednesday afternoon from 1:00 pm until 4:00 pm. You do not need an appointment for the interview!  [facility] [address], [place], [place] [phone]   [facility]  [phone]   [facility]  [phone]   [facility]  [phone]    Behavioral Health Resources in the Community: Intensive Outpatient Programs Organization         Address  Phone  Notes  [facility] [address], [place], [place] [phone]   [facility] [address], [place], [place] [phone]   [facility] [address], [place], [place]  [phone]   [facility] [address],  [place], [place] [phone] or [phone]   Substance Abuse Resources Organization         Address  Phone  Notes  [facility]  [phone]   [facility]  [phone]   [facility]  [phone]   [facility]  [phone]   Residential & Outpatient Substance Abuse Program  [phone]   Psychological Services Organization         Address  Phone  Notes  [facility]  [phone]- [phone]   [facility]  [phone]- [phone]   [facility] [address], [place] [phone] or [phone]    Mobile Crisis Teams Organization         Address  Phone  Notes  [facility], Mobile Crisis Care Unit  [phone]   [facility]  [address] [place], [place] [phone]   Doristine LocksSharon DeEsch [address] [place] [phone]    Self-Help/Support Groups Organization         Address  Phone             Notes  [facility] [place] - variety of support groups  336- [phone] Call for more information  Narcotics Anonymous (NA), [facility] [address], [place]  2 meetings at this location   Statisticianesidential Treatment Programs Organization         Address  Phone  Notes  [facility] [address],    [place]  [phone]   [facility]  [address], [place] [phone], [place], [place] [phone]   [facility] [address], [place] [phone] Admissions: 8am-3pm M-F  [facility] [address]    [place], [place]  [phone]   [facility] [address],  [place], [place] [phone]   [facility] [address]  [place], [place] [phone]   [facility] - Intensive Outpatient [address], [place], [place] [phone]   [facility] ([facility] [address]  [place], [place] [phone] or [phone]   [facility] ([facility]) [address] [place], [place] [phone] Accepts Medicaid  [facility] [address]  [place] [phone] Substance Abuse/Addiction Treatment   [facility] Resources Organization         Address  Phone  Notes  [facility]  [phone]   Angie FavaJulie Brannon, PhD [address], [address] [place], [place]   [phone] or [phone]   [facility]   [address] [place], [place] [phone]) [phone]   [facility] [address], [place], [place] [phone] Insurance/Medicaid/sponsorship through [facility]  [facility] Families [address]                                    [place], [place] [phone] Therapy/tele-psych/case  [facility] [address]   [place], [place] [phone]    Dr. Lolly MustacheArfeen  [phone]) [phone]   [facility]  [employer] [facility] 1) [address], [place] 2) [address], [place] 3)  [address], [place] [phone]  [phone]   [facility] Child Abuse Hotline [phone](336) [phone] or [phone]) [phone] (After Hours)

## 2015-01-11 ENCOUNTER — Emergency Department (HOSPITAL_COMMUNITY)
Admission: EM | Admit: 2015-01-11 | Discharge: 2015-01-11 | Disposition: A | Discharge status: Home / Self Care | Payer: Self-pay | Attending: Emergency Medicine | Admitting: Emergency Medicine

## 2015-01-11 ENCOUNTER — Encounter (HOSPITAL_COMMUNITY): Payer: Self-pay | Admitting: *Deleted

## 2015-01-11 DIAGNOSIS — X102XXA Contact with fats and cooking oils, initial encounter: Secondary | ICD-10-CM | POA: Insufficient documentation

## 2015-01-11 DIAGNOSIS — T22211A Burn of second degree of right forearm, initial encounter: Secondary | ICD-10-CM | POA: Insufficient documentation

## 2015-01-11 DIAGNOSIS — Y9389 Activity, other specified: Secondary | ICD-10-CM | POA: Insufficient documentation

## 2015-01-11 DIAGNOSIS — Y9289 Other specified places as the place of occurrence of the external cause: Secondary | ICD-10-CM | POA: Insufficient documentation

## 2015-01-11 DIAGNOSIS — J45909 Unspecified asthma, uncomplicated: Secondary | ICD-10-CM | POA: Insufficient documentation

## 2015-01-11 DIAGNOSIS — Y990 Civilian activity done for income or pay: Secondary | ICD-10-CM | POA: Insufficient documentation

## 2015-01-11 MED ORDER — HYDROCODONE-ACETAMINOPHEN 5-325 MG PO TABS: 1.0000 | ORAL_TABLET | Freq: Four times a day (QID) | ORAL | Status: DC | PRN

## 2015-01-11 MED ORDER — SILVER SULFADIAZINE 1 % EX CREA
TOPICAL_CREAM | Freq: Once | CUTANEOUS | Status: DC
  Filled 2015-01-11: qty 85

## 2015-01-11 MED ORDER — SILVER SULFADIAZINE 1 % EX CREA: 1.0000 "application " | TOPICAL_CREAM | Freq: Every day | CUTANEOUS | Status: AC

## 2015-01-11 NOTE — ED Notes (Signed)
Applied silvadene and dressing per Provider instruction.

## 2015-01-11 NOTE — Discharge Instructions (Signed)
Burn Care °Your skin is a natural barrier to infection. It is the largest organ of your body. Burns damage this natural protection. To help prevent infection, it is very important to follow your caregiver's instructions in the care of your burn. °Burns are classified as: °· First degree. There is only redness of the skin (erythema). No scarring is expected. °· Second degree. There is blistering of the skin. Scarring may occur with deeper burns. °· Third degree. All layers of the skin are injured, and scarring is expected. °HOME CARE INSTRUCTIONS  °· Wash your hands well before changing your bandage. °· Change your bandage as often as directed by your caregiver. °· Remove the old bandage. If the bandage sticks, you may soak it off with cool, clean water. °· Cleanse the burn thoroughly but gently with mild soap and water. °· Pat the area dry with a clean, dry cloth. °· Apply a thin layer of antibacterial cream to the burn. °· Apply a clean bandage as instructed by your caregiver. °· Keep the bandage as clean and dry as possible. °· Elevate the affected area for the first 24 hours, then as instructed by your caregiver. °· Only take over-the-counter or prescription medicines for pain, discomfort, or fever as directed by your caregiver. °SEEK IMMEDIATE MEDICAL CARE IF:  °· You develop excessive pain. °· You develop redness, tenderness, swelling, or red streaks near the burn. °· The burned area develops yellowish-white fluid (pus) or a bad smell. °· You have a fever. °MAKE SURE YOU:  °· Understand these instructions. °· Will watch your condition. °· Will get help right away if you are not doing well or get worse. °Document Released: 05/15/2005 Document Revised: 08/07/2011 Document Reviewed: 10/05/2010 °ExitCare® Patient Information ©2015 [employer]. This information is not intended to replace advice given to you by your health care provider. Make sure you discuss any questions you have with your health care  provider. ° °Second-Degree Burn °A second-degree burn affects the 2 outer layers of skin. The outer layer (epidermis) and the layer underneath it (dermis) are both burned. Another name for this type of burn is a partial thickness burn. A second-degree burn may be called minor or major. This depends on the size of the burn. It also depends on what parts of the skin are burned. Minor burns may be treated with first aid. Major burns are a medical emergency. °A second-degree burn is worse than a first-degree burn, but not as bad as a third-degree burn. A first-degree burn affects only the epidermis. A third-degree burn goes through all the layers of skin. A second-degree burn usually heals in 3 to 4 weeks. A minor second-degree burn usually does not leave a scar. Deeper second-degree burns may lead to scarring of the skin or contractures over joints. Contractures are scars that form over joints and may lead to reduced mobility at those joints. °CAUSES °· Heat (thermal) injury. This happens when skin comes in contact with something very hot. It could be a flame, a hot object, hot liquid, or steam. Most second-degree burns are thermal injuries. °· Radiation. Sunlight is one type of radiation that can burn the skin. Another type of radiation is used to heat food. Radiation is also used to treat some diseases, such as cancer. All types of radiation can burn the skin. Sunlight usually causes a first-degree burn. Radiation used for heating food or treating a disease can cause a second-degree burn. °· Electricity. Electrical burns can cause more damage under the skin than   on the surface. They should always be treated as major burns. °· Chemicals. Many chemicals can burn the skin. The burn should be flushed with cool water and checked by an emergency caregiver. °SYMPTOMS °Symptoms of second-degree burns include: °· Severe pain. °· Extreme tenderness. °· Deep redness. °· Blistered skin. °· Skin that has changed color. It might  look blotchy, wet, or shiny. °· Swelling. °TREATMENT °Some second-degree burns may need to be treated in a hospital. These include major burns, electrical burns, and chemical burns. Many other second-degree burns can be treated with regular first aid, such as: °· Cooling the burn. Use cool, germ-free (sterile) salt water. Place the burned area of skin into a tub of water, or cover the burned area with clean, wet towels. °· Taking pain medicine. °· Removing the dead skin from broken blisters. A trained caregiver may do this. Do not pop blisters. °· Gently washing your skin with mild soap. °· Covering the burned area with a cream. Silver sulfadiazine is a cream for burns. An antibiotic cream, such as bacitracin, may also be used to fight infection. Do not use other ointments or creams unless your caregiver says it is okay. °· Protecting the burn with a sterile, non-sticky bandage. °· Bandaging fingers and toes separately. This keeps them from sticking together. °· Taking an antibiotic. This can help prevent infection. °· Getting a tetanus shot. °HOME CARE INSTRUCTIONS °Medication °· Take any medicine prescribed by your caregiver. Follow the directions carefully. °· Ask your caregiver if you can take over-the-counter medicine to relieve pain and swelling. Do not give aspirin to children. °· Make sure your caregiver knows about all other medicines you take. This includes over-the-counter medicines. °Burn care °· You will need to change the bandage on your burn. You may need to do this 2 or 3 times each day. °¨ Gently clean the burned area. °¨ Put ointment on it. °¨ Cover the burn with a sterile bandage. °· For some deeper burns or burns that cover a large area, compression garments may be prescribed. These garments can help minimize scarring and protect your mobility. °· Do not put butter or oil on your skin. Use only the cream prescribed by your caregiver. °· Do not put ice on your burn. °· Do not break blisters on  your skin. °· Keep the bandaged area dry. You might need to take a sponge bath for awhile. Ask your caregiver when you can take a shower or a tub bath again. °· Do not scratch an itchy burn. Your caregiver may give you medicine to relieve very bad itching. °· Infection is a big danger after a second-degree burn. Tell your caregiver right away if you have signs of infection, such as: °¨ Redness or changing color in the burned area. °¨ Fluid leaking from the burn. °¨ Swelling in the burn area. °¨ A bad smell coming from the wound. °Follow-up °· Keep all follow-up appointments. This is important. This is how your caregiver can tell if your treatment is working. °· Protect your burn from sunlight. Use sunscreen whenever you go outside. Burned areas may be sensitive to the sun for up to 1 year. Exposure to the sun may also cause permanent darkening of scars. °SEEK MEDICAL CARE IF: °· You have any questions about medicines. °· You have any questions about your treatment. °· You wonder if it is okay to do a particular activity. °· You develop a fever of more than 100.5° F (38.1° C). °SEEK IMMEDIATE MEDICAL CARE IF: °· You think your burn   might be infected. It may change color, become red, leak fluid, swell, or smell bad. °· You develop a fever of more than 102° F (38.9° C). °Document Released: 10/17/2010 Document Revised: 08/07/2011 Document Reviewed: 10/17/2010 °ExitCare® Patient Information ©2015 [employer]. This information is not intended to replace advice given to you by your health care provider. Make sure you discuss any questions you have with your health care provider. ° °

## 2015-01-11 NOTE — ED Provider Notes (Signed)
CSN: [id number]     Arrival date & time 01/11/15  1337 History  This chart was scribed for non-physician practitioner, Arthor Captain, PA-C working with Azalia Bilis, MD, by Jarvis Morgan, ED Scribe. This patient was seen in room TR08C/TR08C and the patient's care was started at 2:07 PM.    Chief Complaint  Patient presents with  . Burn    The history is provided by the patient. No language interpreter was used.    HPI Comments: Jeffrey Greene is a 24 y.o. male who presents to the Emergency Department complaining of a burn to his right forearm onset 5 days ago. The burn extends down his right forearm into the dorsum of his right hand. Pt states he works at [employer] and burned his arm on hot oil.  He reports 10/10 pain. He states the pain is exacerbated by heat and sunlight. He has been applying neosporin on the area with no relief. His tetanus vaccination is UTD. He denies any fever, chills, nausea or vomiting.        Past Medical History  Diagnosis Date  . Asthma    Past Surgical History  Procedure Laterality Date  . Hand surgery     History reviewed. No pertinent family history. Social History  Substance Use Topics  . Smoking status: Never Smoker   . Smokeless tobacco: None  . Alcohol Use: Yes     Comment: occasion    Review of Systems  Constitutional: Negative for fever and chills.  Gastrointestinal: Negative for nausea and vomiting.  Skin: Positive for color change and wound (burn to right forearm).      Allergies  Review of patient's allergies indicates no known allergies.  Home Medications   Prior to Admission medications   Medication Sig Start Date End Date Taking? Authorizing Provider  cyclobenzaprine (FLEXERIL) 10 MG tablet Take 1 tablet (10 mg total) by mouth 3 (three) times daily as needed for muscle spasms. Patient not taking: Reported on 04/02/2014 03/06/12   [name], MD  HYDROcodone-acetaminophen (NORCO/VICODIN) 5-325 MG per tablet Take 1  tablet by mouth every 6 (six) hours as needed for severe pain. 03/18/14   Felicie Morn, NP  ibuprofen (ADVIL,MOTRIN) 800 MG tablet Take 1 tablet (800 mg total) by mouth 3 (three) times daily. Patient not taking: Reported on 04/02/2014 03/06/12   [name], MD   Triage Vitals: BP 133/65 mmHg  Pulse 100  Temp(Src) 98.3 F (36.8 C) (Oral)  Resp 18  Ht  (1.753 m)  Wt 190 lb (86.183 kg)  BMI 28.05 kg/m2  SpO2 96%  Physical Exam  Constitutional: He is oriented to person, place, and time. He appears well-developed and well-nourished. No distress.  HENT:  Head: Normocephalic and atraumatic.  Eyes: Conjunctivae and EOM are normal.  Neck: Neck supple. No tracheal deviation present.  Cardiovascular: Normal rate.   Pulmonary/Chest: Effort normal. No respiratory distress.  Musculoskeletal: Normal range of motion.  Neurological: He is alert and oriented to person, place, and time.  Skin: Skin is warm and dry.  2nd degree burn involving dorsum of hand, not circumferential but nearly. 2nd degree burn extending into antecubital fossa and humeral area  Psychiatric: He has a normal mood and affect. His behavior is normal.  Nursing note and vitals reviewed.   ED Course  Procedures (including critical care time)  DIAGNOSTIC STUDIES: Oxygen Saturation is 96% on RA, normal by my interpretation.    COORDINATION OF CARE:    Labs Review Labs Reviewed -  No data to display  Imaging Review No results found.    EKG Interpretation None      MDM   Final diagnoses:  Burn of forearm, right, second degree, initial encounter    Patient with second degree burn of the right forearm, non-circumferential. Covering approximately 2% of body.  No signs of secondary infection. The patient will be discharged with silvadene,. Pain meds and home care instruction.I have discussed the case with the attending phuysician Dr. Patria Mane. Patient appears safe for discharge. I personally performed the services  described in this documentation, which was scribed in my presence. The recorded information has been reviewed and is accurate.       Arthor Captain, PA-C 01/11/15 1638  Azalia Bilis, MD 01/11/15 [phone]

## 2015-01-11 NOTE — ED Notes (Addendum)
Pt reports hot oil burning right forearm at work on Thursday, no relief with neosporin. Has first and second degree burns to right forearm and posterior hand.

## 2015-08-20 ENCOUNTER — Encounter (HOSPITAL_COMMUNITY): Payer: Self-pay | Admitting: Emergency Medicine

## 2015-08-20 ENCOUNTER — Emergency Department (INDEPENDENT_AMBULATORY_CARE_PROVIDER_SITE_OTHER)
Admission: EM | Admit: 2015-08-20 | Discharge: 2015-08-20 | Disposition: A | Discharge status: Home / Self Care | Payer: Self-pay | Source: Home / Self Care | Attending: Emergency Medicine | Admitting: Emergency Medicine

## 2015-08-20 DIAGNOSIS — R6889 Other general symptoms and signs: Secondary | ICD-10-CM

## 2015-08-20 NOTE — ED Provider Notes (Signed)
CSN: [id number]     Arrival date & time 08/20/15  1434 History   First MD Initiated Contact with Patient 08/20/15 1603     Chief Complaint  Patient presents with  . URI  . Fever   (Consider location/radiation/quality/duration/timing/severity/associated sxs/prior Treatment) HPI  He is a 25 year old man here for evaluation of flu symptoms. He states his symptoms started yesterday evening with fever, chills, intense body aches, sore throat, and cough. He reports a little bit of nasal congestion. He is also had some diarrhea, but denies nausea or vomiting. He did not take a temperature. He did take some ibuprofen last night. His girlfriend was sick with flu last week.  Past Medical History  Diagnosis Date  . Asthma    Past Surgical History  Procedure Laterality Date  . Hand surgery     No family history on file. Social History  Substance Use Topics  . Smoking status: Never Smoker   . Smokeless tobacco: None  . Alcohol Use: Yes     Comment: occasion    Review of Systems As in history of present illness Allergies  Review of patient's allergies indicates no known allergies.  Home Medications   Prior to Admission medications   Medication Sig Start Date End Date Taking? Authorizing Provider  HYDROcodone-acetaminophen (NORCO/VICODIN) 5-325 MG per tablet Take 1 tablet by mouth every 6 (six) hours as needed for severe pain. 01/11/15   Arthor CaptainAbigail Harris, PA-C  silver sulfADIAZINE (SILVADENE) 1 % cream Apply 1 application topically daily. 01/11/15   Arthor CaptainAbigail Harris, PA-C   Meds Ordered and Administered this Visit  Medications - No data to display  BP 122/60 mmHg  Pulse 108  Temp(Src) 98.8 F (37.1 C) (Oral)  SpO2 95% No data found.   Physical Exam  Constitutional: He is oriented to person, place, and time. He appears well-developed and well-nourished. No distress.  HENT:  Mouth/Throat: Oropharynx is clear and moist. No oropharyngeal exudate.  Mild nasal erythema  Neck: Neck  supple.  Cardiovascular: Normal rate, regular rhythm and normal heart sounds.   No murmur heard. Pulmonary/Chest: Effort normal and breath sounds normal. No respiratory distress. He has no wheezes. He has no rales.  Lymphadenopathy:    He has no cervical adenopathy.  Neurological: He is alert and oriented to person, place, and time.    ED Course  Procedures (including critical care time)  Labs Review Labs Reviewed - No data to display  Imaging Review No results found.    MDM   1. Flu-like symptoms    Discussed Tamiflu, patient declined. Symptomatic treatment with rest, fluids, ibuprofen/Tylenol. Work note provided. Follow-up as needed.    Charm RingsErin J Racquelle Hyser, MD 08/20/15 [phone]

## 2015-08-20 NOTE — Discharge Instructions (Signed)
You likely have the flu. Get lots of rest and drink lots of fluids. Alternate Tylenol and ibuprofen every 4 hours to help with fever and body aches. You can take TheraFlu in place of the Tylenol. You should start to feel better around Monday. It will likely be another week before you feel all the way back to normal. Follow-up as needed.

## 2015-08-20 NOTE — ED Notes (Signed)
Here with flu-like sx's, cough, fever,chills and sore throat Started yesterday Not taking otc medications Denies n,v,d vss

## 2016-10-19 ENCOUNTER — Encounter (HOSPITAL_COMMUNITY): Payer: Self-pay | Admitting: *Deleted

## 2016-10-19 ENCOUNTER — Emergency Department (HOSPITAL_COMMUNITY)
Admission: EM | Admit: 2016-10-19 | Discharge: 2016-10-19 | Disposition: A | Discharge status: Home / Self Care | Payer: Self-pay | Attending: Emergency Medicine | Admitting: Emergency Medicine

## 2016-10-19 ENCOUNTER — Emergency Department (HOSPITAL_COMMUNITY): Payer: Self-pay

## 2016-10-19 DIAGNOSIS — Y939 Activity, unspecified: Secondary | ICD-10-CM | POA: Insufficient documentation

## 2016-10-19 DIAGNOSIS — Y929 Unspecified place or not applicable: Secondary | ICD-10-CM | POA: Insufficient documentation

## 2016-10-19 DIAGNOSIS — M79642 Pain in left hand: Secondary | ICD-10-CM

## 2016-10-19 DIAGNOSIS — W228XXA Striking against or struck by other objects, initial encounter: Secondary | ICD-10-CM | POA: Insufficient documentation

## 2016-10-19 DIAGNOSIS — Y999 Unspecified external cause status: Secondary | ICD-10-CM | POA: Insufficient documentation

## 2016-10-19 DIAGNOSIS — J45909 Unspecified asthma, uncomplicated: Secondary | ICD-10-CM | POA: Insufficient documentation

## 2016-10-19 DIAGNOSIS — S60222A Contusion of left hand, initial encounter: Secondary | ICD-10-CM | POA: Insufficient documentation

## 2016-10-19 MED ORDER — IBUPROFEN 400 MG PO TABS
600.0000 mg | ORAL_TABLET | Freq: Once | ORAL | Status: AC
  Administered 2016-10-19: 13:00:00 600 mg via ORAL
  Filled 2016-10-19: qty 1

## 2016-10-19 MED ORDER — IBUPROFEN 600 MG PO TABS: 600.0000 mg | ORAL_TABLET | Freq: Four times a day (QID) | ORAL | 0 refills | Status: DC | PRN

## 2016-10-19 NOTE — Discharge Instructions (Signed)
Take your medications as prescribed. I also recommend resting, elevating and applying ice to your hand for 15-20 minutes 3-4 times daily. Follow-up with the hand surgery clinic with the below if your symptoms have not improved over the next week for reevaluation. Return to the emergency department if symptoms worsen or new onset of fever, redness, swelling, warmth, numbness, weakness, decreased range of motion.

## 2016-10-19 NOTE — ED Provider Notes (Signed)
[facility]-EMERGENCY DEPT Provider Note    By signing my name below, I, Earmon PhoenixJennifer Waddell, attest that this documentation has been prepared under the direction and in the presence of Melburn HakeNicole Nadeau, PA-C. Electronically Signed: Earmon PhoenixJennifer Waddell, ED Scribe. 10/19/16. 12:49 PM.    History   Chief Complaint Chief Complaint  Patient presents with  . Hand Pain   The history is provided by the patient and medical records. No language interpreter was used.    Jeffrey Greene is a 26 y.o. male who presents to the Emergency Department complaining of left index finger pain that began last night. He reports associated swelling. He states he may have punched a concrete wall and someone's face. He has not taken anything for pain but has been icing the area. Touching or moving the finger increases the pain. He denies numbness, tingling or weakness of the left index finger or hand, bruising, wounds.    Past Medical History:  Diagnosis Date  . Asthma     There are no active problems to display for this patient.   Past Surgical History:  Procedure Laterality Date  . HAND SURGERY         Home Medications    Prior to Admission medications   Medication Sig Start Date End Date Taking? Authorizing Provider  HYDROcodone-acetaminophen (NORCO/VICODIN) 5-325 MG per tablet Take 1 tablet by mouth every 6 (six) hours as needed for severe pain. 01/11/15   Arthor CaptainHarris, Abigail, PA-C  silver sulfADIAZINE (SILVADENE) 1 % cream Apply 1 application topically daily. 01/11/15   Arthor CaptainHarris, Abigail, PA-C    Family History No family history on file.  Social History Social History  Substance Use Topics  . Smoking status: Never Smoker  . Smokeless tobacco: Never Used  . Alcohol use Yes     Comment: occasion     Allergies   Patient has no known allergies.   Review of Systems Review of Systems  Musculoskeletal: Positive for arthralgias and joint swelling.  Skin: Negative for color change and wound.    Neurological: Negative for weakness and numbness.     Physical Exam Updated Vital Signs BP 118/70 (BP Location: Right Arm)   Pulse 92   Temp 97.7 F (36.5 C) (Oral)   Resp 16   SpO2 98%   Physical Exam  Constitutional: He is oriented to person, place, and time. He appears well-developed and well-nourished.  HENT:  Head: Normocephalic and atraumatic.  Eyes: Conjunctivae and EOM are normal. Right eye exhibits no discharge. Left eye exhibits no discharge. No scleral icterus.  Pulmonary/Chest: Effort normal.  Musculoskeletal: He exhibits tenderness. He exhibits no deformity.  Mild TTP over MCP joint of left index finger without swelling, erythema, warmth or ecchymosis. Decreased ROM of MCP joint of left index finger due to pain. Full ROM of PIP and DIP joints of left index finger along with remaining left digits, wrist, forearm and elbow. 5/5 strength. Radial pulse 2+. Sensation intact. Cap refill less than 2 seconds. No abrasion or laceration.  Neurological: He is alert and oriented to person, place, and time.  Nursing note and vitals reviewed.    ED Treatments / Results  DIAGNOSTIC STUDIES: Oxygen Saturation is 98% on RA, normal by my interpretation.   COORDINATION OF CARE: 12:44 PM- Will order Ibuprofen prior to imaging. Pt verbalizes understanding and agrees to plan.  Medications  ibuprofen (ADVIL,MOTRIN) tablet 600 mg (not administered)    Labs (all labs ordered are listed, but only abnormal results are displayed) Labs Reviewed -  No data to display  EKG  EKG Interpretation None       Radiology No results found.  Procedures Procedures (including critical care time)  Medications Ordered in ED Medications  ibuprofen (ADVIL,MOTRIN) tablet 600 mg (not administered)     Initial Impression / Assessment and Plan / ED Course  I have reviewed the triage vital signs and the nursing notes.  Pertinent labs & imaging results that were available during my care of  the patient were reviewed by me and considered in my medical decision making (see chart for details).     Patient X-Ray negative for obvious fracture or dislocation. Pt advised to follow up with orthopedics. Conservative therapy recommended and discussed. Patient will be discharged home & is agreeable with above plan. Returns precautions discussed. Pt appears safe for discharge.   Final Clinical Impressions(s) / ED Diagnoses   Final diagnoses:  Left hand pain    New Prescriptions New Prescriptions   No medications on file    I personally performed the services described in this documentation, which was scribed in my presence. The recorded information has been reviewed and is accurate.     Barrett Henle, PA-C 10/19/16 1403    Melene Plan, DO 10/19/16 [phone]

## 2016-10-19 NOTE — ED Triage Notes (Signed)
To ED for eval of left hand pain since punching an object. Pt states 'it probably was a cement wall'. No open wounds to hand. Decreased mobility first knuckle. Minimal to no swelling noted. Pt has iced hand since last pm

## 2016-10-19 NOTE — ED Notes (Signed)
Applied ice to pt's finger

## 2016-10-19 NOTE — ED Notes (Signed)
Pt has range of motion present. Painful with movement.

## 2017-12-08 ENCOUNTER — Other Ambulatory Visit: Payer: Self-pay

## 2017-12-08 ENCOUNTER — Encounter (HOSPITAL_COMMUNITY): Payer: Self-pay

## 2017-12-08 ENCOUNTER — Emergency Department (HOSPITAL_COMMUNITY)
Admission: EM | Admit: 2017-12-08 | Discharge: 2017-12-08 | Disposition: A | Discharge status: Home / Self Care | Payer: Self-pay | Attending: Emergency Medicine | Admitting: Emergency Medicine

## 2017-12-08 DIAGNOSIS — J45909 Unspecified asthma, uncomplicated: Secondary | ICD-10-CM | POA: Insufficient documentation

## 2017-12-08 DIAGNOSIS — Z79899 Other long term (current) drug therapy: Secondary | ICD-10-CM | POA: Insufficient documentation

## 2017-12-08 DIAGNOSIS — K047 Periapical abscess without sinus: Secondary | ICD-10-CM | POA: Insufficient documentation

## 2017-12-08 MED ORDER — PENICILLIN V POTASSIUM 500 MG PO TABS: 500.0000 mg | ORAL_TABLET | Freq: Four times a day (QID) | ORAL | 0 refills | Status: DC

## 2017-12-08 MED ORDER — IBUPROFEN 800 MG PO TABS: 800.0000 mg | ORAL_TABLET | Freq: Three times a day (TID) | ORAL | 0 refills | Status: DC | PRN

## 2017-12-08 MED ORDER — TRAMADOL HCL 50 MG PO TABS: 50.0000 mg | ORAL_TABLET | Freq: Four times a day (QID) | ORAL | 0 refills | Status: DC | PRN

## 2017-12-08 NOTE — Discharge Instructions (Signed)
Return here as needed.  Follow-up with the dentist provided.  Rinse with warm water peroxide 3 times a day. warm compresses on the jawline where it swollen.

## 2017-12-08 NOTE — ED Triage Notes (Signed)
Pt states that he broke his tooth a few months ago and over the past 2-3 days he has had swelling and pain in the area. Pt reports taking some of his girlfriends antibiotics without any relief, unsure of name of medication states he took 3 pills over 2 days. NAD, speaking in full sentences, swelling noted in left lower face/mouth.

## 2017-12-08 NOTE — ED Provider Notes (Signed)
[facility] Note   CSN: [id number] Arrival date & time: 12/08/17  [id number]     History   Chief Complaint Chief Complaint  Patient presents with  . Dental Pain    HPI [name] is a 27 y.o. male.  HPI Patient presents to the emergency department with dental pain and jaw swelling on the left lower dentition.  Patient states that he did not take any medications prior to arrival.  The patient states the symptoms started 5 days ago.  The patient states nothing seems to make the condition better but palpation makes the pain worse.  Patient states that he does not see a dentist.  Patient denies throat swelling, tongue swelling fever, nausea, vomiting or syncope. Past Medical History:  Diagnosis Date  . Asthma     There are no active problems to display for this patient.   Past Surgical History:  Procedure Laterality Date  . HAND SURGERY          Home Medications    Prior to Admission medications   Medication Sig Start Date End Date Taking? Authorizing Provider  HYDROcodone-acetaminophen (NORCO/VICODIN) 5-325 MG per tablet Take 1 tablet by mouth every 6 (six) hours as needed for severe pain. 01/11/15   Arthor CaptainHarris, Abigail, PA-C  ibuprofen (ADVIL,MOTRIN) 600 MG tablet Take 1 tablet (600 mg total) by mouth every 6 (six) hours as needed. 10/19/16   Barrett HenleNadeau, Nicole Elizabeth, PA-C  silver sulfADIAZINE (SILVADENE) 1 % cream Apply 1 application topically daily. 01/11/15   Arthor CaptainHarris, Abigail, PA-C    Family History No family history on file.  Social History Social History   Tobacco Use  . Smoking status: Never Smoker  . Smokeless tobacco: Never Used  Substance Use Topics  . Alcohol use: Yes    Comment: occasion  . Drug use: Not Currently    Types: Marijuana     Allergies   Patient has no known allergies.   Review of Systems Review of Systems All other systems negative except as documented in the HPI. All pertinent positives  and negatives as reviewed in the HPI. Physical Exam Updated Vital Signs BP 127/78 (BP Location: Right Arm)   Pulse 67   Temp 97.7 F (36.5 C) (Oral)   Resp 18   Ht 5\' 10"  (1.778 m)   Wt 86.2 kg (190 lb)   SpO2 99%   BMI 27.26 kg/m   Physical Exam  Constitutional: He is oriented to person, place, and time. He appears well-developed and well-nourished. No distress.  HENT:  Head: Normocephalic and atraumatic.  Mouth/Throat:    Eyes: Pupils are equal, round, and reactive to light.  Pulmonary/Chest: Effort normal.  Neurological: He is alert and oriented to person, place, and time.  Skin: Skin is warm and dry.  Psychiatric: He has a normal mood and affect.  Nursing note and vitals reviewed.    ED Treatments / Results  Labs (all labs ordered are listed, but only abnormal results are displayed) Labs Reviewed - No data to display  EKG None  Radiology No results found.  Procedures Procedures (including critical care time)  Medications Ordered in ED Medications - No data to display   Initial Impression / Assessment and Plan / ED Course  I have reviewed the triage vital signs and the nursing notes.  Pertinent labs & imaging results that were available during my care of the patient were reviewed by me and considered in my medical decision making (see chart for  details).     Told to return here as needed.  Told to follow-up with dentistry.  Patient agrees the plan all questions were answered.  I did advise him to rinse with warm water peroxide 3 times a day.  Final Clinical Impressions(s) / ED Diagnoses   Final diagnoses:  None    ED Discharge Orders    None       Charlestine Night, Cordelia Poche 12/08/17 1030    Raeford Razor, MD 12/10/17 1535

## 2018-02-03 ENCOUNTER — Encounter (HOSPITAL_COMMUNITY): Payer: Self-pay | Admitting: Emergency Medicine

## 2018-02-03 ENCOUNTER — Emergency Department (HOSPITAL_COMMUNITY)
Admission: EM | Admit: 2018-02-03 | Discharge: 2018-02-03 | Disposition: A | Discharge status: Home / Self Care | Payer: Self-pay | Attending: Emergency Medicine | Admitting: Emergency Medicine

## 2018-02-03 ENCOUNTER — Other Ambulatory Visit: Payer: Self-pay

## 2018-02-03 DIAGNOSIS — J45909 Unspecified asthma, uncomplicated: Secondary | ICD-10-CM | POA: Insufficient documentation

## 2018-02-03 DIAGNOSIS — K047 Periapical abscess without sinus: Secondary | ICD-10-CM | POA: Insufficient documentation

## 2018-02-03 DIAGNOSIS — Z79899 Other long term (current) drug therapy: Secondary | ICD-10-CM | POA: Insufficient documentation

## 2018-02-03 MED ORDER — AMOXICILLIN 500 MG PO CAPS: 500.0000 mg | ORAL_CAPSULE | Freq: Three times a day (TID) | ORAL | 0 refills | Status: DC

## 2018-02-03 MED ORDER — CHLORHEXIDINE GLUCONATE 0.12% ORAL RINSE (MEDLINE KIT): 15.0000 mL | Freq: Two times a day (BID) | OROMUCOSAL | 0 refills | Status: DC

## 2018-02-03 NOTE — ED Triage Notes (Signed)
Pt. Stated, toothache on the left side for 2 days

## 2018-02-03 NOTE — ED Provider Notes (Signed)
[place] EMERGENCY DEPARTMENT Provider Note   CSN: [id number] Arrival date & time: 02/03/18  1025     History   Chief Complaint Chief Complaint  Patient presents with  . Dental Pain    HPI Jeffrey Greene is a 27 y.o. male with a history of asthma who presents to the emergency department with a chief complaint of dental pain.  Patient reports left-sided, lower dental pain that began 2 days ago.  He reports that last night the area around the tooth began to swell.  He reports that he noted some mild swelling to the left side of his face.  No drainage from the area around the tooth.  He states that he is unable to open his mouth as wide, but he has had no difficulty eating or drinking.  He denies fever, chills, drooling, muffled voice, neck pain or swelling, sore throat, shortness of breath, or feelings of his throat is closing.  He states that he had a similar problem earlier this year and was given penicillin, but did not have any of the antibiotic left.  He did not follow up with a dentist previously because he just started a new job and could not afford to take any days off or he would lose his job.  He is not established with a dentist at this time.  He has been treating the area by gargling warm salt water.  He reports pain has been mild and improved with ibuprofen and Tylenol.  The history is provided by the patient. No language interpreter was used.    Past Medical History:  Diagnosis Date  . Asthma     There are no active problems to display for this patient.   Past Surgical History:  Procedure Laterality Date  . HAND SURGERY          Home Medications    Prior to Admission medications   Medication Sig Start Date End Date Taking? Authorizing Provider  amoxicillin (AMOXIL) 500 MG capsule Take 1 capsule (500 mg total) by mouth 3 (three) times daily. 02/03/18   Shayleen Eppinger A, PA-C  chlorhexidine gluconate, MEDLINE KIT, (PERIDEX) 0.12 % solution  Use as directed 15 mLs in the mouth or throat 2 (two) times daily. 02/03/18   Audre Cenci A, PA-C  HYDROcodone-acetaminophen (NORCO/VICODIN) 5-325 MG per tablet Take 1 tablet by mouth every 6 (six) hours as needed for severe pain. 01/11/15   Margarita Mail, PA-C  ibuprofen (ADVIL,MOTRIN) 800 MG tablet Take 1 tablet (800 mg total) by mouth every 8 (eight) hours as needed. 12/08/17   Lawyer, Harrell Gave, PA-C  penicillin v potassium (VEETID) 500 MG tablet Take 1 tablet (500 mg total) by mouth 4 (four) times daily. 12/08/17   Lawyer, Harrell Gave, PA-C  silver sulfADIAZINE (SILVADENE) 1 % cream Apply 1 application topically daily. 01/11/15   Margarita Mail, PA-C  traMADol (ULTRAM) 50 MG tablet Take 1 tablet (50 mg total) by mouth every 6 (six) hours as needed for severe pain. 12/08/17   Lawyer, Harrell Gave, PA-C    Family History No family history on file.  Social History Social History   Tobacco Use  . Smoking status: Never Smoker  . Smokeless tobacco: Never Used  Substance Use Topics  . Alcohol use: Yes    Comment: occasion  . Drug use: Not Currently    Types: Marijuana     Allergies   Patient has no known allergies.   Review of Systems Review of Systems  Constitutional: Negative  for appetite change, chills and fever.  HENT: Positive for dental problem and facial swelling. Negative for congestion, drooling, ear discharge, ear pain, mouth sores, sinus pressure, sinus pain, sore throat and trouble swallowing.   Respiratory: Negative for shortness of breath.   Cardiovascular: Negative for chest pain.  Gastrointestinal: Negative for abdominal pain.  Genitourinary: Negative for dysuria.  Musculoskeletal: Negative for back pain.  Skin: Negative for rash.  Allergic/Immunologic: Negative for immunocompromised state.  Neurological: Negative for seizures, weakness and headaches.  Psychiatric/Behavioral: Negative for confusion.     Physical Exam Updated Vital Signs Ht _0  (1.778 m)    Wt 83.9 kg   BMI 26.54 kg/m   Physical Exam  Constitutional: He appears well-developed.  Well-appearing.  No acute distress.  HENT:  Head: Normocephalic.  Mouth/Throat: Uvula is midline, oropharynx is clear and moist and mucous membranes are normal. He does not have dentures. No trismus in the jaw. Abnormal dentition. Dental abscesses and dental caries present. No uvula swelling or lacerations. No tonsillar exudate.    Poor dentition.  Multiple avulsed teeth.  Significant edema to the gingiva around tooth 19, concerning for dental abscess.  No active or drainage able to be expressed.  The tooth is fractured on all surfaces, but no obvious pulp exposure.  The tooth is not fractured to the level of the gumline.  No trismus or drooling.  No woody induration.  Mild swelling to the left cheek.  Eyes: Conjunctivae are normal.  Neck: Normal range of motion. Neck supple.  Full active and passive range of motion of the neck.  Neck is supple without edema.  No palpable anterior or posterior cervical lymphadenopathy.   Cardiovascular: Normal rate, regular rhythm and intact distal pulses. Exam reveals no gallop and no friction rub.  No murmur heard. Pulmonary/Chest: Effort normal. No stridor. No respiratory distress. He has no wheezes. He has no rales. He exhibits no tenderness.  Speaks in complete, fluent sentences without difficulty.  Breathing is not labored.  Abdominal: Soft. He exhibits no distension.  Lymphadenopathy:    He has no cervical adenopathy.  Neurological: He is alert.  Skin: Skin is warm and dry.  Psychiatric: His behavior is normal.  Nursing note and vitals reviewed.  ED Treatments / Results  Labs (all labs ordered are listed, but only abnormal results are displayed) Labs Reviewed - No data to display  EKG None  Radiology No results found.  Procedures Procedures (including critical care time)  Medications Ordered in ED Medications - No data to display   Initial  Impression / Assessment and Plan / ED Course  I have reviewed the triage vital signs and the nursing notes.  Pertinent labs & imaging results that were available during my care of the patient were reviewed by me and considered in my medical decision making (see chart for details).     27 year old male with a history of asthma presenting with dental pain for 2 days and facial swelling since last night.  On exam, the patient has a dental abscess present at tooth 19.  No drainage is able to be expressed and no active drainage.  He has no trismus, drooling, or dysphagia.  He is tolerating fluids without difficulty and speaking in complete, fluent sentences.   The patient is adamant that he does not want the area incised and drained at this time.  He states that the last time he was seen by his symptoms were worse and improved with just antibiotics.  We had  a lengthy shared decision making conversation regarding risk and benefits, and the patient competently stated that he preferred antibiotics and follow-up with a dentist when their office reopens tomorrow.  At this time doubt peritonsillar abscess, Ludwig's angina, retropharyngeal abscess, or other deep space infection of the neck.  The patient and I discussed at length return precautions to the ED.  We will discharge the patient with amoxicillin and chlorhexidine mouthwash.  He has not required pain medication in the ED at this time and his pain is been adequately controlled at home with over-the-counter anti-inflammatories.  He is hemodynamically stable and in no acute distress.  He is safe for discharge home with outpatient follow-up at this time.  Final Clinical Impressions(s) / ED Diagnoses   Final diagnoses:  Dental abscess    ED Discharge Orders         Ordered    amoxicillin (AMOXIL) 500 MG capsule  3 times daily     02/03/18 1055    chlorhexidine gluconate, MEDLINE KIT, (PERIDEX) 0.12 % solution  2 times daily     02/03/18 1055             Rai Sinagra A, PA-C 02/03/18 1109    Carmin Muskrat, MD 02/03/18 1555

## 2018-02-03 NOTE — Discharge Instructions (Signed)
Thank you for allowing me to care for you today in the Emergency Department.   Call Dr. Lacretia Leigh office first thing tomorrow morning to schedule follow-up appointment.  If for some reason you are not able to be seen by Dr. Lucky Cowboy, I have also attached a dental resource guide along with your discharge paperwork.  I have provided you with a work note so that you can call the dentist tomorrow to schedule follow-up appointment.  You have been diagnosed with a dental abscess today.  The typical treatment for this is to drain the area.  After we discussed different options today, you prefer to start antibiotics and follow-up tomorrow with the dentist.  Take 1 tablet of amoxicillin every 8 hours for the next 7 days.  For pain control, you can take 600 mg of ibuprofen with food or 650 mg of Tylenol.  To keep the area clean, gargle warm salt water up to 5 times a day.  You can also fill the prescription for chlorhexidine mouthwash and use 2 times a day.  You should return to the emergency department if you develop worsening symptoms that include pain and swelling to one side of the neck, if you start drooling because you are unable to swallow your own saliva, if you feel as if your throat is closing or you get short of breath, or if you become unable to open your mouth to swallow liquids or take pills.

## 2018-08-30 IMAGING — CR DG HAND COMPLETE 3+V*L*
3 series · 3 of 3 positions shown · non-contrast
Comparison: 10/09/2011

CLINICAL DATA: Left hand pain

EXAM:
LEFT HAND - COMPLETE 3+ VIEW

[hand pa]
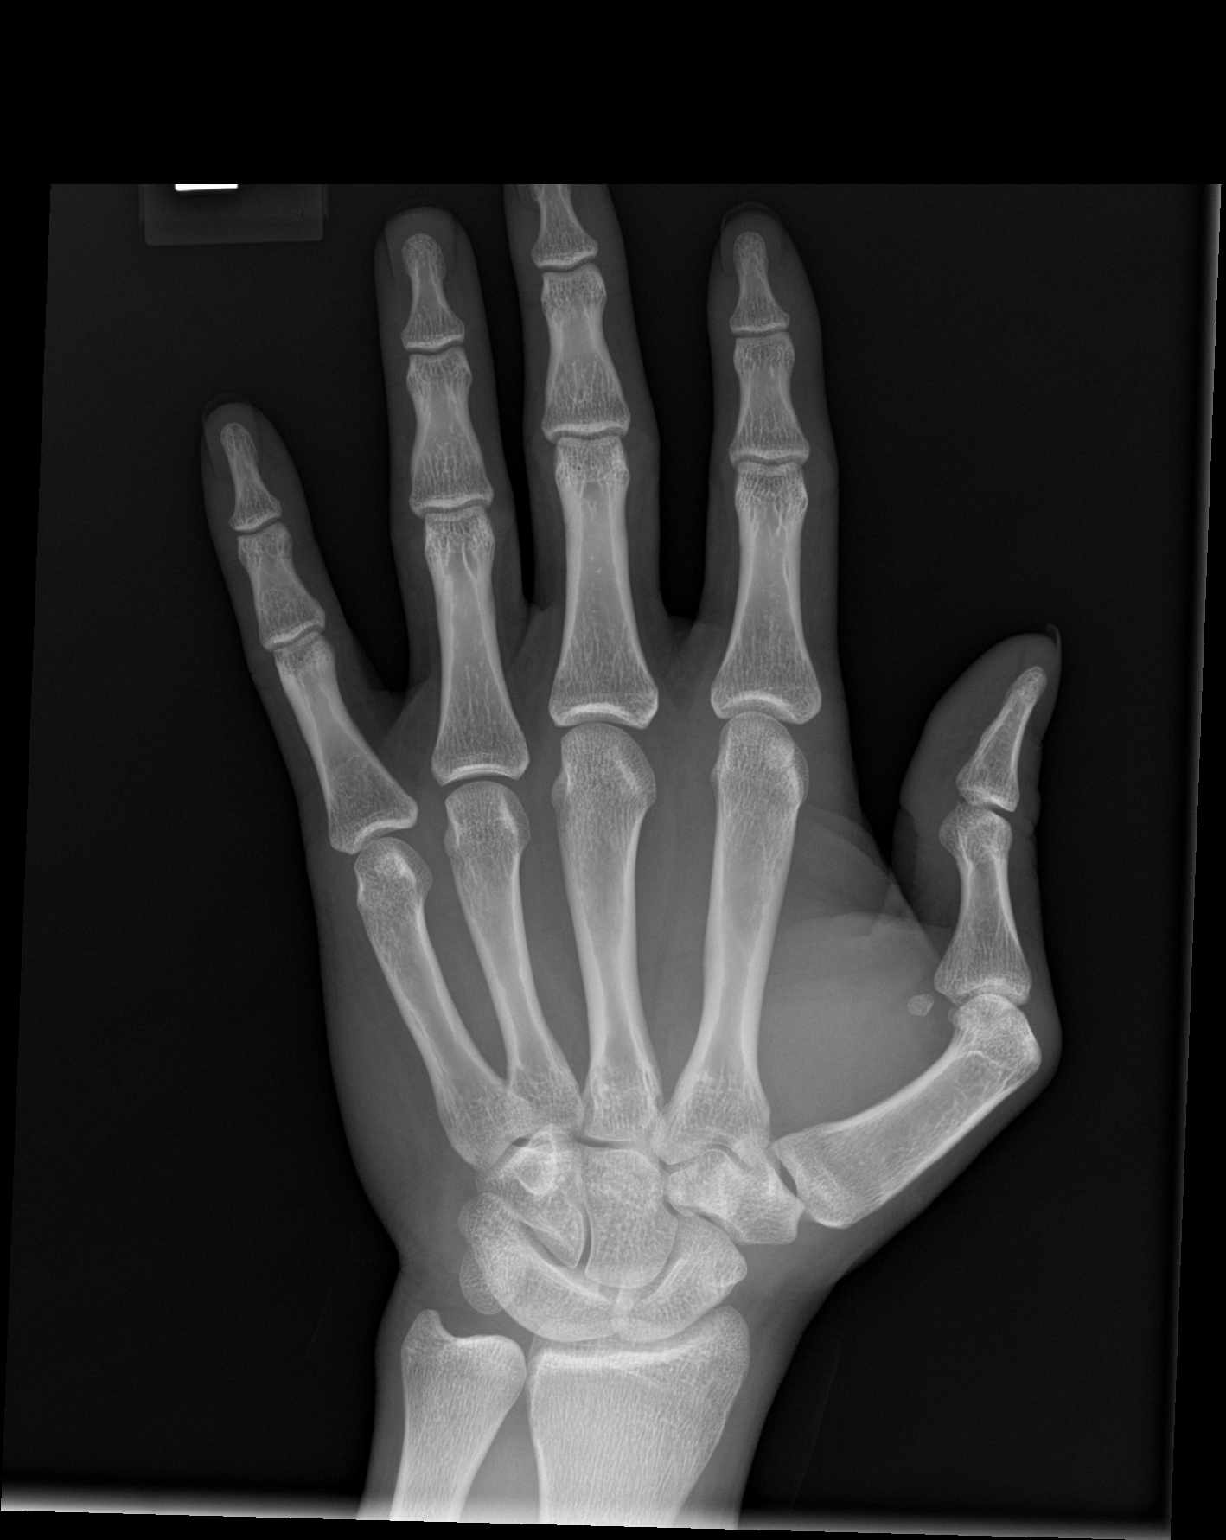

[hand obl]
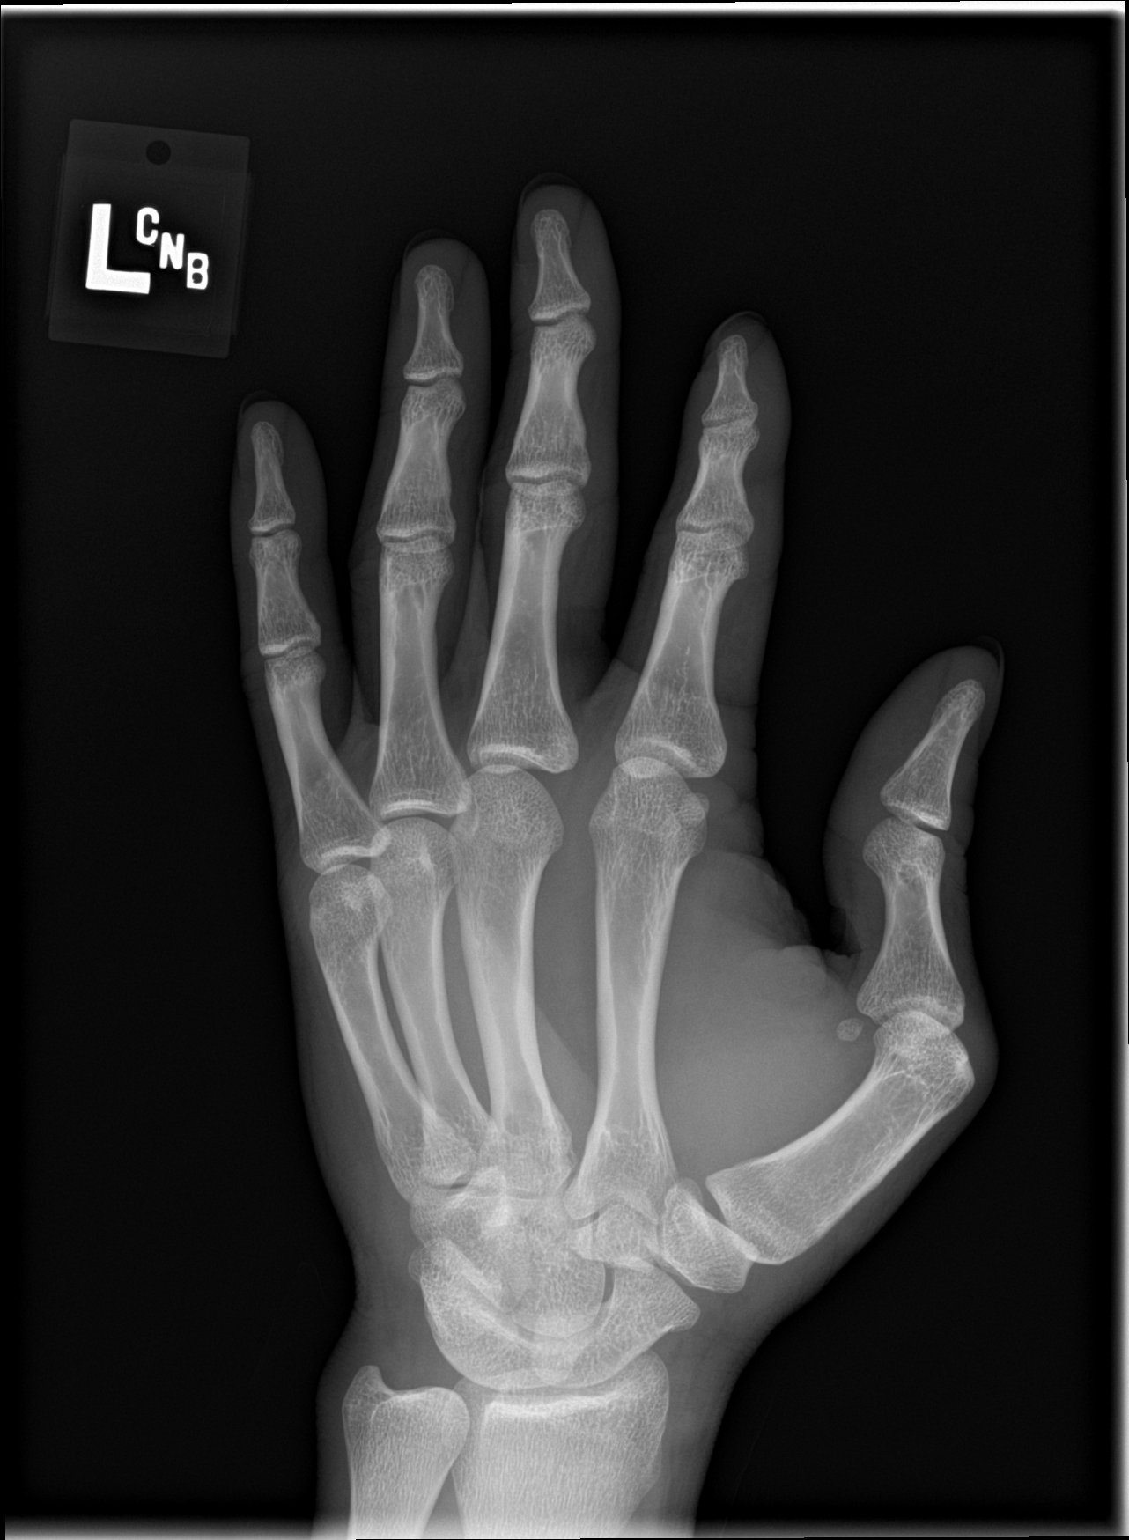

[hand lat]
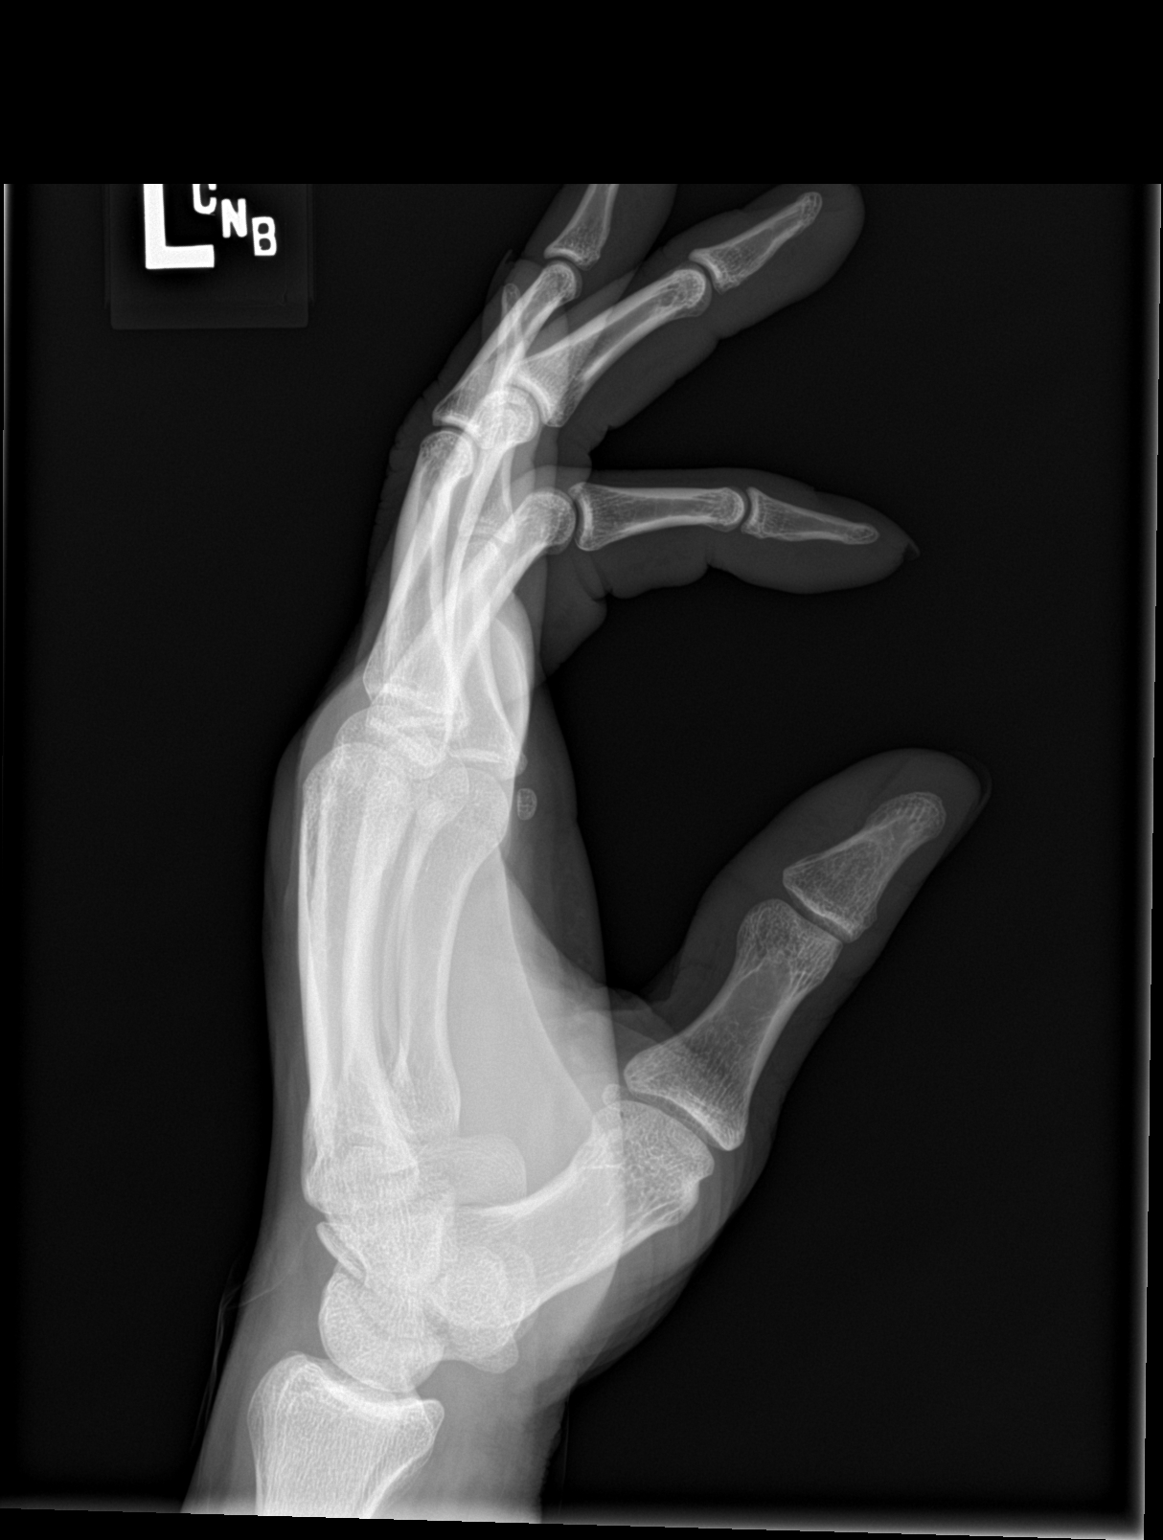

[3 of 3 positions shown; findings below may reference images not displayed]

FINDINGS: There is no evidence of fracture or dislocation. There is no
evidence of arthropathy or other focal bone abnormality. Soft
tissues are unremarkable.
IMPRESSION: Negative.

## 2019-01-14 ENCOUNTER — Ambulatory Visit (HOSPITAL_COMMUNITY)
Admission: EM | Admit: 2019-01-14 | Discharge: 2019-01-14 | Disposition: A | Discharge status: Home / Self Care | Payer: Self-pay | Attending: Family Medicine | Admitting: Family Medicine

## 2019-01-14 ENCOUNTER — Encounter (HOSPITAL_COMMUNITY): Payer: Self-pay

## 2019-01-14 ENCOUNTER — Other Ambulatory Visit: Payer: Self-pay

## 2019-01-14 DIAGNOSIS — K047 Periapical abscess without sinus: Secondary | ICD-10-CM

## 2019-01-14 MED ORDER — AMOXICILLIN-POT CLAVULANATE 875-125 MG PO TABS: 1.0000 | ORAL_TABLET | Freq: Two times a day (BID) | ORAL | 0 refills | Status: AC

## 2019-01-14 MED ORDER — CHLORHEXIDINE GLUCONATE 0.12% ORAL RINSE (MEDLINE KIT): 15.0000 mL | Freq: Two times a day (BID) | OROMUCOSAL | 0 refills | Status: DC

## 2019-01-14 MED ORDER — IBUPROFEN 800 MG PO TABS: 800.0000 mg | ORAL_TABLET | Freq: Three times a day (TID) | ORAL | 0 refills | Status: DC

## 2019-01-14 NOTE — ED Provider Notes (Signed)
[place]    CSN: [id number] Arrival date & time: 01/14/19  4163      History   Chief Complaint Chief Complaint  Patient presents with  . Abscess    HPI Jeffrey Greene is a 28 y.o. male history of asthma presenting today for evaluation of possible dental infection.  Patient states that over the past couple days he has had increased pain and swelling around his left lower posterior jaw.  He denies any fevers.  Denies difficulty moving neck or neck stiffness.  Feels as if his lymph nodes are swollen and tender.  He notes that he has 2 broken molars on his left lower jaw.  He has been using Tylenol and ibuprofen 400 mg.  HPI  Past Medical History:  Diagnosis Date  . Asthma     There are no active problems to display for this patient.   Past Surgical History:  Procedure Laterality Date  . HAND SURGERY         Home Medications    Prior to Admission medications   Medication Sig Start Date End Date Taking? Authorizing Provider  amoxicillin-clavulanate (AUGMENTIN) 875-125 MG tablet Take 1 tablet by mouth every 12 (twelve) hours for 7 days. 01/14/19 01/21/19  ,  C, PA-C  chlorhexidine gluconate, MEDLINE KIT, (PERIDEX) 0.12 % solution Use as directed 15 mLs in the mouth or throat 2 (two) times daily. 02/03/18   McDonald, Mia A, PA-C  ibuprofen (ADVIL) 800 MG tablet Take 1 tablet (800 mg total) by mouth 3 (three) times daily. 01/14/19   ,  C, PA-C  silver sulfADIAZINE (SILVADENE) 1 % cream Apply 1 application topically daily. 01/11/15   Margarita Mail, PA-C    Family History No family history on file.  Social History Social History   Tobacco Use  . Smoking status: Never Smoker  . Smokeless tobacco: Never Used  Substance Use Topics  . Alcohol use: Yes    Comment: occasion  . Drug use: Not Currently    Types: Marijuana     Allergies   Patient has no known allergies.   Review of Systems Review of Systems  Constitutional:  Negative for activity change, appetite change, chills, fatigue and fever.  HENT: Positive for dental problem. Negative for congestion, ear pain, rhinorrhea, sinus pressure, sore throat and trouble swallowing.   Eyes: Negative for discharge and redness.  Respiratory: Negative for cough, chest tightness and shortness of breath.   Cardiovascular: Negative for chest pain.  Gastrointestinal: Negative for abdominal pain, diarrhea, nausea and vomiting.  Musculoskeletal: Negative for myalgias, neck pain and neck stiffness.  Skin: Negative for rash.  Neurological: Negative for dizziness, light-headedness and headaches.     Physical Exam Triage Vital Signs ED Triage Vitals  Enc Vitals Group     BP 01/14/19 0849 127/78     Pulse Rate 01/14/19 0849 87     Resp 01/14/19 0849 18     Temp 01/14/19 0849 98.2 F (36.8 C)     Temp Source 01/14/19 0849 Oral     SpO2 01/14/19 0849 98 %     Weight 01/14/19 0846 188 lb (85.3 kg)     Height --      Head Circumference --      Peak Flow --      Pain Score 01/14/19 0846 10     Pain Loc --      Pain Edu? --      Excl. in GC? --    No  data found.  Updated Vital Signs BP 127/78 (BP Location: Right Arm)   Pulse 87   Temp 98.2 F (36.8 C) (Oral)   Resp 18   Wt 188 lb (85.3 kg)   SpO2 98%   BMI 26.98 kg/m   Visual Acuity Right Eye Distance:   Left Eye Distance:   Bilateral Distance:    Right Eye Near:   Left Eye Near:    Bilateral Near:     Physical Exam Vitals signs and nursing note reviewed.  Constitutional:      Appearance: He is well-developed.  HENT:     Head: Normocephalic and atraumatic.     Comments: No obvious facial swelling    Mouth/Throat:     Comments: Oral mucosa pink and moist, no tonsillar enlargement or exudate. Posterior pharynx patent and nonerythematous, no uvula deviation or swelling. Normal phonation. No soft palate swelling, no swelling or tenderness to soft palate below tongue  2 posterior molars on left lower  jaw one partially broken, other with remaining intact, surrounding gingival tenderness, no obvious swelling Eyes:     Conjunctiva/sclera: Conjunctivae normal.  Neck:     Musculoskeletal: Neck supple.     Comments: No swelling or erythema, tenderness to palpation to area underneath mandible, no palpable lymphadenopathy, but tender in tonsillar area Cardiovascular:     Rate and Rhythm: Normal rate and regular rhythm.     Heart sounds: No murmur.  Pulmonary:     Effort: Pulmonary effort is normal. No respiratory distress.     Breath sounds: Normal breath sounds.  Abdominal:     Palpations: Abdomen is soft.     Tenderness: There is no abdominal tenderness.  Skin:    General: Skin is warm and dry.  Neurological:     Mental Status: He is alert.      UC Treatments / Results  Labs (all labs ordered are listed, but only abnormal results are displayed) Labs Reviewed - No data to display  EKG   Radiology No results found.  Procedures Procedures (including critical care time)  Medications Ordered in UC Medications - No data to display  Initial Impression / Assessment and Plan / UC Course  I have reviewed the triage vital signs and the nursing notes.  Pertinent labs & imaging results that were available during my care of the patient were reviewed by me and considered in my medical decision making (see chart for details).     Patient with dental pain.  Tenderness to area underneath the left mandible, no soft palate swelling, no obvious facial swelling, vital signs stable, do not suspect Ludwig's at this time.  Will initiate on Augmentin, Tylenol and ibuprofen for pain.  Advised to continue to monitor, if developing worsening symptoms, increased swelling to this area or redness, increased tenderness to follow-up in emergency room.Discussed strict return precautions. Patient verbalized understanding and is agreeable with plan.  Final Clinical Impressions(s) / UC Diagnoses   Final  diagnoses:  Dental abscess     Discharge Instructions     Please use dental resource to contact offices to seek permenant treatment/relief.   Begin Augmentin twice daily for the next week.  This should help with pain as any infection is cleared.   For pain please take 632m-800mg of Ibuprofen every 8 hours, take with 1000 mg of Tylenol Extra strength every 8 hours. These are safe to take together. Please take with food.   Please return if you start to experience significant swelling of your  face, experiencing fever.    ED Prescriptions    Medication Sig Dispense Auth. Provider   amoxicillin-clavulanate (AUGMENTIN) 875-125 MG tablet Take 1 tablet by mouth every 12 (twelve) hours for 7 days. 14 tablet ,  C, PA-C   ibuprofen (ADVIL) 800 MG tablet Take 1 tablet (800 mg total) by mouth 3 (three) times daily. 21 tablet , [place] C, PA-C     Controlled Substance Prescriptions [place] Controlled Substance Registry consulted? Not Applicable   Janith Lima, [place] 01/14/19 1962

## 2019-01-14 NOTE — ED Triage Notes (Signed)
Pt has a abscess on his gum and his tooth is broken. Pt has a swelling in his gum. This started yesterday.

## 2019-01-14 NOTE — Discharge Instructions (Signed)
Please use dental resource to contact offices to seek permenant treatment/relief.   Begin Augmentin twice daily for the next week.  This should help with pain as any infection is cleared.   For pain please take 600mg -800mg  of Ibuprofen every 8 hours, take with 1000 mg of Tylenol Extra strength every 8 hours. These are safe to take together. Please take with food.   Please return if you start to experience significant swelling of your face, experiencing fever.

## 2019-07-11 ENCOUNTER — Encounter (HOSPITAL_COMMUNITY): Payer: Self-pay

## 2019-07-11 ENCOUNTER — Ambulatory Visit (HOSPITAL_COMMUNITY)
Admission: EM | Admit: 2019-07-11 | Discharge: 2019-07-11 | Disposition: A | Discharge status: Home / Self Care | Payer: Self-pay | Attending: Physician Assistant | Admitting: Physician Assistant

## 2019-07-11 ENCOUNTER — Other Ambulatory Visit: Payer: Self-pay

## 2019-07-11 DIAGNOSIS — K047 Periapical abscess without sinus: Secondary | ICD-10-CM

## 2019-07-11 MED ORDER — CHLORHEXIDINE GLUCONATE 0.12% ORAL RINSE (MEDLINE KIT): 15.0000 mL | Freq: Two times a day (BID) | OROMUCOSAL | 0 refills | Status: AC

## 2019-07-11 MED ORDER — CHLORHEXIDINE GLUCONATE 0.12% ORAL RINSE (MEDLINE KIT): 15.0000 mL | Freq: Two times a day (BID) | OROMUCOSAL | 0 refills | Status: DC

## 2019-07-11 MED ORDER — AMOXICILLIN-POT CLAVULANATE 875-125 MG PO TABS: 1.0000 | ORAL_TABLET | Freq: Two times a day (BID) | ORAL | 0 refills | Status: AC

## 2019-07-11 MED ORDER — IBUPROFEN 800 MG PO TABS: 800.0000 mg | ORAL_TABLET | Freq: Three times a day (TID) | ORAL | 0 refills | Status: DC

## 2019-07-11 NOTE — Discharge Instructions (Addendum)
Begin the augmentin today. And use the mouth wash daily  Take 1 800mg  ibuprofen and  3 x 325mg  regular strength tylenol every 8 hours  Please follow up with one of the supplied dental offices today to be evaluated next week  If you develop fever, chills, difficulty swallowing, please return or go to the Emergency Department

## 2019-07-11 NOTE — ED Triage Notes (Signed)
Pt is here with dental pain, states his tooth had broke off in his gum ago & his recent dental pain started yesterday.

## 2019-07-11 NOTE — ED Provider Notes (Signed)
[facility]    CSN: [id number] Arrival date & time: 07/11/19  1200       History   Chief Complaint Chief Complaint  Patient presents with  . Dental Pain    HPI Jeffrey Greene is a 29 y.o. male.   Patient with a history of dental abscess and other dental infections reports today for dental pain and concern of dental infection. He reports pain is on left lower rear jaw and teeth. He endorse broken teeth on that side as well. Pain has become much worse over the last 1 week or so. He has tried tylenol PM and that helps him sleep. He denies difficulty swallowing. He does endorse some neck pain below the affected teeth. Denies fever and chills.  He reports having similar issues with teeth on the right side through 2019 and 2020. He reports having teeth pulled and teeth on the left side being watched.      Past Medical History:  Diagnosis Date  . Asthma     There are no problems to display for this patient.   Past Surgical History:  Procedure Laterality Date  . HAND SURGERY         Home Medications    Prior to Admission medications   Medication Sig Start Date End Date Taking? Authorizing Provider  amoxicillin-clavulanate (AUGMENTIN) 875-125 MG tablet Take 1 tablet by mouth every 12 (twelve) hours for 10 days. 07/11/19 07/21/19  Jalisia Puchalski, Marguerita Beards, PA-C  chlorhexidine gluconate, MEDLINE KIT, (PERIDEX) 0.12 % solution Use as directed 15 mLs in the mouth or throat 2 (two) times daily for 16 days. 07/11/19 07/27/19  Kashara Blocher, Marguerita Beards, PA-C  ibuprofen (ADVIL) 800 MG tablet Take 1 tablet (800 mg total) by mouth 3 (three) times daily. 07/11/19   Rebie Peale, Marguerita Beards, PA-C  silver sulfADIAZINE (SILVADENE) 1 % cream Apply 1 application topically daily. 01/11/15   Margarita Mail, PA-C    Family History Family History  Problem Relation Age of Onset  . Diabetes Mother   . Healthy Father     Social History Social History   Tobacco Use  . Smoking status: Never Smoker  . Smokeless  tobacco: Never Used  Substance Use Topics  . Alcohol use: Yes    Comment: occasion  . Drug use: Not Currently    Types: Marijuana     Allergies   Patient has no known allergies.   Review of Systems Review of Systems  Constitutional: Negative for chills and fever.  HENT: Positive for dental problem. Negative for drooling, ear pain, facial swelling, mouth sores, sinus pressure, sinus pain, sore throat and trouble swallowing.   Skin: Negative for wound.  Neurological: Negative for headaches.  Hematological: Negative for adenopathy.  All other systems reviewed and are negative.    Physical Exam Triage Vital Signs ED Triage Vitals  Enc Vitals Group     BP 07/11/19 1251 122/70     Pulse Rate 07/11/19 1251 70     Resp 07/11/19 1251 18     Temp 07/11/19 1251 98.2 F (36.8 C)     Temp Source 07/11/19 1251 Oral     SpO2 07/11/19 1251 98 %     Weight 07/11/19 1248 190 lb (86.2 kg)     Height --      Head Circumference --      Peak Flow --      Pain Score 07/11/19 1247 10     Pain Loc --  Pain Edu? --      Excl. in [place]? --    No data found.  Updated Vital Signs BP 122/70 (BP Location: Right Arm)   Pulse 70   Temp 98.2 F (36.8 C) (Oral)   Resp 18   Wt 190 lb (86.2 kg)   SpO2 98%   BMI 27.26 kg/m   Visual Acuity Right Eye Distance:   Left Eye Distance:   Bilateral Distance:    Right Eye Near:   Left Eye Near:    Bilateral Near:     Physical Exam Vitals and nursing note reviewed.  Constitutional:      Appearance: He is well-developed.  HENT:     Head: Normocephalic and atraumatic.     Mouth/Throat:     Lips: Pink.     Mouth: Mucous membranes are moist. No injury.     Dentition: Abnormal dentition. Dental tenderness, gingival swelling, dental caries and dental abscesses (at pre-molar and 1st molar on left lower aspect) present.     Tongue: No lesions.     Palate: No mass and lesions.     Pharynx: Oropharynx is clear. Uvula midline. No pharyngeal  swelling, oropharyngeal exudate or posterior oropharyngeal erythema.     Tonsils: No tonsillar exudate or tonsillar abscesses.      Comments: Generally poor dentition.  Eyes:     Conjunctiva/sclera: Conjunctivae normal.  Cardiovascular:     Rate and Rhythm: Normal rate and regular rhythm.     Heart sounds: No murmur.  Pulmonary:     Effort: Pulmonary effort is normal. No respiratory distress.     Breath sounds: Normal breath sounds.  Abdominal:     Palpations: Abdomen is soft.     Tenderness: There is no abdominal tenderness.  Musculoskeletal:     Cervical back: Neck supple.  Skin:    General: Skin is warm and dry.  Neurological:     Mental Status: He is alert.      UC Treatments / Results  Labs (all labs ordered are listed, but only abnormal results are displayed) Labs Reviewed - No data to display  EKG   Radiology No results found.  Procedures Procedures (including critical care time)  Medications Ordered in UC Medications - No data to display  Initial Impression / Assessment and Plan / UC Course  I have reviewed the triage vital signs and the nursing notes.  Pertinent labs & imaging results that were available during my care of the patient were reviewed by me and considered in my medical decision making (see chart for details).     #Dental infection Afebrile, no facial involvement. Clearly needs tooth removal by dentist.  - augmentin started, tylenol and ibuprofen for pain - discussed need to call today to have appointment hopefully next week - return and ED precautions discussed   Final Clinical Impressions(s) / UC Diagnoses   Final diagnoses:  Dental abscess     Discharge Instructions     Begin the augmentin today. And use the mouth wash daily  Take 1 824m ibuprofen and  3 x 3265mregular strength tylenol every 8 hours  Please follow up with one of the supplied dental offices today to be evaluated next week  If you develop fever, chills,  difficulty swallowing, please return or go to the Emergency Department     ED Prescriptions    Medication Sig Dispense Auth. Provider   chlorhexidine gluconate, MEDLINE KIT, (PERIDEX) 0.12 % solution  (Status: Discontinued) Use as directed 15  mLs in the mouth or throat 2 (two) times daily for 16 days. 473 mL Ziare Cryder, Marguerita Beards, PA-C   amoxicillin-clavulanate (AUGMENTIN) 875-125 MG tablet Take 1 tablet by mouth every 12 (twelve) hours for 10 days. 20 tablet Dammon Makarewicz, Marguerita Beards, PA-C   ibuprofen (ADVIL) 800 MG tablet Take 1 tablet (800 mg total) by mouth 3 (three) times daily. 21 tablet Smith Potenza, Marguerita Beards, PA-C   chlorhexidine gluconate, MEDLINE KIT, (PERIDEX) 0.12 % solution Use as directed 15 mLs in the mouth or throat 2 (two) times daily for 16 days. 473 mL Rivaan Kendall, Marguerita Beards, PA-C     I have reviewed the PDMP during this encounter.   Purnell Shoemaker, PA-C 07/11/19 1327

## 2021-11-30 ENCOUNTER — Encounter (HOSPITAL_COMMUNITY): Payer: Self-pay | Admitting: Emergency Medicine

## 2021-11-30 ENCOUNTER — Emergency Department (HOSPITAL_COMMUNITY)
Admission: EM | Admit: 2021-11-30 | Discharge: 2021-12-01 | Discharge status: Against Medical Advice | Payer: Self-pay | Attending: Physician Assistant | Admitting: Physician Assistant

## 2021-11-30 DIAGNOSIS — Z5321 Procedure and treatment not carried out due to patient leaving prior to being seen by health care provider: Secondary | ICD-10-CM | POA: Insufficient documentation

## 2021-11-30 DIAGNOSIS — M542 Cervicalgia: Secondary | ICD-10-CM | POA: Insufficient documentation

## 2021-11-30 MED ORDER — ACETAMINOPHEN 325 MG PO TABS: 650.0000 mg | ORAL_TABLET | Freq: Once | ORAL | Status: DC

## 2021-11-30 MED ORDER — METHOCARBAMOL 500 MG PO TABS: 500.0000 mg | ORAL_TABLET | Freq: Once | ORAL | Status: DC

## 2021-11-30 NOTE — ED Triage Notes (Signed)
Pt reports neck pain when he moves it X3 days, no apparent injuries. No OTC medications

## 2021-11-30 NOTE — ED Provider Triage Note (Signed)
Emergency Medicine Provider Triage Evaluation Note  Jeffrey Greene , a 31 y.o. male  was evaluated in triage.  Pt complains of neck pain.  Symptoms have been going on for the past 2 to 3 days.  Reports soreness that is worse with movement.  No injury or trauma.  No prior neck surgeries.  Denies any fever, numbness, headache or blurry vision.  Review of Systems  Positive: Neck pain Negative: Above  Physical Exam  BP 125/73   Pulse 94   Temp 98.9 F (37.2 C) (Oral)   Resp 16   SpO2 94%  Gen:   Awake, no distress   Resp:  Normal effort  MSK:   Moves extremities without difficulty  Other:  Tenderness to palpation of the cervical right paraspinal musculature without midline tenderness  Medical Decision Making  Medically screening exam initiated at 10:02 PM.  Appropriate orders placed.  Jeffrey Greene was informed that the remainder of the evaluation will be completed by another provider, this initial triage assessment does not replace that evaluation, and the importance of remaining in the ED until their evaluation is complete.  We will order medications   Dietrich Pates, PA-C 11/30/21 2203

## 2021-12-01 NOTE — ED Notes (Signed)
Pt called 3x no answer  

## 2021-12-01 NOTE — ED Notes (Signed)
Patient called for registration no response

## 2022-10-16 ENCOUNTER — Emergency Department (HOSPITAL_COMMUNITY)
Admission: EM | Admit: 2022-10-16 | Discharge: 2022-10-16 | Disposition: A | Discharge status: Home / Self Care | Payer: Medicaid Other | Attending: Emergency Medicine | Admitting: Emergency Medicine

## 2022-10-16 DIAGNOSIS — K029 Dental caries, unspecified: Secondary | ICD-10-CM | POA: Diagnosis not present

## 2022-10-16 DIAGNOSIS — K0381 Cracked tooth: Secondary | ICD-10-CM | POA: Insufficient documentation

## 2022-10-16 DIAGNOSIS — K0889 Other specified disorders of teeth and supporting structures: Secondary | ICD-10-CM | POA: Diagnosis not present

## 2022-10-16 MED ORDER — ACETAMINOPHEN 500 MG PO TABS: 500.0000 mg | ORAL_TABLET | Freq: Four times a day (QID) | ORAL | 0 refills | Status: AC | PRN

## 2022-10-16 MED ORDER — AMOXICILLIN 500 MG PO CAPS: 500.0000 mg | ORAL_CAPSULE | Freq: Three times a day (TID) | ORAL | 0 refills | Status: AC

## 2022-10-16 MED ORDER — LIDOCAINE VISCOUS HCL 2 % MT SOLN: 15.0000 mL | OROMUCOSAL | 0 refills | Status: AC | PRN

## 2022-10-16 MED ORDER — LIDOCAINE VISCOUS HCL 2 % MT SOLN
15.0000 mL | Freq: Once | OROMUCOSAL | Status: AC
  Administered 2022-10-16: 15 mL via OROMUCOSAL
  Filled 2022-10-16: qty 15

## 2022-10-16 MED ORDER — IBUPROFEN 600 MG PO TABS: 600.0000 mg | ORAL_TABLET | Freq: Four times a day (QID) | ORAL | 0 refills | Status: AC | PRN

## 2022-10-16 MED ORDER — ACETAMINOPHEN 500 MG PO TABS
1000.0000 mg | ORAL_TABLET | Freq: Once | ORAL | Status: AC
  Administered 2022-10-16: 1000 mg via ORAL
  Filled 2022-10-16: qty 2

## 2022-10-16 MED ORDER — LIDOCAINE 5 % EX PTCH: 1.0000 | MEDICATED_PATCH | CUTANEOUS | 0 refills | Status: DC

## 2022-10-16 NOTE — ED Provider Notes (Signed)
[place] EMERGENCY DEPARTMENT AT [facility] Provider Note   CSN: [id number] Arrival date & time: 10/16/22  2134     History No chief complaint on file.   Jeffrey Greene is a 32 y.o. male presenting today with multiple weeks of dental pain.  Reports that he just got health insurance so he is going to try and get in with a dentist.  Localizes it to the right side.  Says that he has a hole in his tooth and is having pain that radiates down his neck.  HPI     Home Medications Prior to Admission medications   Medication Sig Start Date End Date Taking? Authorizing Provider  acetaminophen (TYLENOL) 500 MG tablet Take 1 tablet (500 mg total) by mouth every 6 (six) hours as needed. 10/16/22  Yes Jaidin Ugarte A, PA-C  amoxicillin (AMOXIL) 500 MG capsule Take 1 capsule (500 mg total) by mouth 3 (three) times daily. 10/16/22  Yes Taylah Dubiel A, PA-C  ibuprofen (ADVIL) 600 MG tablet Take 1 tablet (600 mg total) by mouth every 6 (six) hours as needed. 10/16/22  Yes Vincie Linn A, PA-C  lidocaine (LIDODERM) 5 % Place 1 patch onto the skin daily. Remove & Discard patch within 12 hours or as directed by MD 10/16/22  Yes Sweetie Giebler A, PA-C  silver sulfADIAZINE (SILVADENE) 1 % cream Apply 1 application topically daily. 01/11/15   Arthor Captain, PA-C      Allergies    Patient has no known allergies.    Review of Systems   Review of Systems  Physical Exam Updated Vital Signs BP 112/75 (BP Location: Right Arm)   Pulse 63   Temp 99 F (37.2 C)   Resp 16   SpO2 96%  Physical Exam Vitals and nursing note reviewed.  Constitutional:      Appearance: Normal appearance.  HENT:     Head: Normocephalic and atraumatic.     Mouth/Throat:     Comments: All teeth in very poor dentition.  There is a right lower molar has been pulled.  Last molar with caries and erosion.  Multiple dental caries and broken teeth throughout the rest of the mouth.  Airway clear, tolerating  secretions.  No signs of PTA/RPA.  Tolerating secretions, no concern for Ludwig's angina Eyes:     General: No scleral icterus.    Conjunctiva/sclera: Conjunctivae normal.  Pulmonary:     Effort: Pulmonary effort is normal. No respiratory distress.  Skin:    Findings: No rash.  Neurological:     Mental Status: He is alert.  Psychiatric:        Mood and Affect: Mood normal.     ED Results / Procedures / Treatments   Labs (all labs ordered are listed, but only abnormal results are displayed) Labs Reviewed - No data to display  EKG None  Radiology No results found.  Procedures Procedures   Medications Ordered in ED Medications  lidocaine (XYLOCAINE) 2 % viscous mouth solution 15 mL (has no administration in time range)  acetaminophen (TYLENOL) tablet 1,000 mg (has no administration in time range)    ED Course/ Medical Decision Making/ A&P                             Medical Decision Making Risk OTC drugs. Prescription drug management.   32 year old male who presents today with dental pain.  Has been going on for the past couple of  weeks.  He is going to try and get in with a dentist soon as possible.  He has been using ibuprofen without relief of his symptoms.  Airway clear and tolerating secretions.  No signs of abscess.  No Ludwig's angina.  Do not believe that there is any intervention that can be performed from the emergency department.  I will start the patient on antibiotics and prescribe ibuprofen, Tylenol and lidocaine solution.  Patient is agreeable to discharge from triage.   Final Clinical Impression(s) / ED Diagnoses Final diagnoses:  Pain due to dental caries    Rx / DC Orders ED Discharge Orders          Ordered    amoxicillin (AMOXIL) 500 MG capsule  3 times daily        10/16/22 2144    ibuprofen (ADVIL) 600 MG tablet  Every 6 hours PRN        10/16/22 2144    acetaminophen (TYLENOL) 500 MG tablet  Every 6 hours PRN        10/16/22 2144     lidocaine (LIDODERM) 5 %  Every 24 hours,   Status:  Discontinued        10/16/22 2144    lidocaine (XYLOCAINE) 2 % solution  As needed        10/16/22 2147           Results and diagnoses were explained to the patient. Return precautions discussed in full. Patient had no additional questions and expressed complete understanding.   This chart was dictated using voice recognition software.  Despite best efforts to proofread,  errors can occur which can change the documentation meaning.    Woodroe Chen 10/16/22 2147    Terald Sleeper, MD 10/16/22 2205

## 2022-10-16 NOTE — Discharge Instructions (Addendum)
You came to the emergency department today due to dental pain.  You do have multiple cavities as well as broken teeth.  Please follow-up with a dentist to soon as possible.  You may call any dentist around [place] however if you are having trouble getting and you may also reference the resources I have attached to these papers.  Tylenol and ibuprofen should be alternated for pain.  You may take 1000 mg of Tylenol and then 3 hours later take 600 mg of ibuprofen and continue to alternate accordingly.  I have also sent lidocaine solution to your pharmacy.  Do not eat or drink anything hot for 30 minutes after this because it may burn your mouth while it is numb.  Do not hesitate to return with any worsening symptoms.  It was a pleasure to meet you and I hope you feel better!
